# Patient Record
Sex: Female | Born: 1950 | Race: White | Hispanic: No | Marital: Married | Smoking: Never smoker
Health system: Southern US, Community
[De-identification: ages and names within clinical notes are randomized; demographics above are authoritative.]

## PROBLEM LIST (undated history)

## (undated) DIAGNOSIS — I10 Essential (primary) hypertension: Secondary | ICD-10-CM

## (undated) DIAGNOSIS — T7840XA Allergy, unspecified, initial encounter: Secondary | ICD-10-CM

## (undated) DIAGNOSIS — R928 Other abnormal and inconclusive findings on diagnostic imaging of breast: Secondary | ICD-10-CM

## (undated) DIAGNOSIS — K5730 Diverticulosis of large intestine without perforation or abscess without bleeding: Secondary | ICD-10-CM

## (undated) DIAGNOSIS — E890 Postprocedural hypothyroidism: Secondary | ICD-10-CM

## (undated) DIAGNOSIS — Z1211 Encounter for screening for malignant neoplasm of colon: Secondary | ICD-10-CM

## (undated) DIAGNOSIS — Z9889 Other specified postprocedural states: Secondary | ICD-10-CM

## (undated) DIAGNOSIS — Z124 Encounter for screening for malignant neoplasm of cervix: Secondary | ICD-10-CM

## (undated) DIAGNOSIS — R112 Nausea with vomiting, unspecified: Secondary | ICD-10-CM

## (undated) DIAGNOSIS — N841 Polyp of cervix uteri: Secondary | ICD-10-CM

## (undated) DIAGNOSIS — K219 Gastro-esophageal reflux disease without esophagitis: Secondary | ICD-10-CM

## (undated) DIAGNOSIS — E7800 Pure hypercholesterolemia, unspecified: Secondary | ICD-10-CM

## (undated) HISTORY — DX: Diverticulosis of large intestine without perforation or abscess without bleeding: K57.30

## (undated) HISTORY — PX: OTHER SURGICAL HISTORY: SHX169

## (undated) HISTORY — DX: Pure hypercholesterolemia, unspecified: E78.00

## (undated) HISTORY — DX: Allergy, unspecified, initial encounter: T78.40XA

## (undated) HISTORY — DX: Essential (primary) hypertension: I10

## (undated) HISTORY — DX: Postprocedural hypothyroidism: E89.0

## (undated) HISTORY — DX: Encounter for screening for malignant neoplasm of colon: Z12.11

## (undated) HISTORY — DX: Encounter for screening for malignant neoplasm of cervix: Z12.4

## (undated) HISTORY — DX: Polyp of cervix uteri: N84.1

## (undated) HISTORY — DX: Gastro-esophageal reflux disease without esophagitis: K21.9

---

## 1989-03-03 HISTORY — PX: TUBAL LIGATION: SHX77

## 1998-04-14 ENCOUNTER — Ambulatory Visit (HOSPITAL_COMMUNITY): Admission: RE | Admit: 1998-04-14 | Discharge: 1998-04-14 | Payer: Self-pay | Admitting: Family Medicine

## 1999-09-07 ENCOUNTER — Ambulatory Visit (HOSPITAL_COMMUNITY): Admission: RE | Admit: 1999-09-07 | Discharge: 1999-09-07 | Payer: Self-pay | Admitting: Unknown Physician Specialty

## 1999-12-06 HISTORY — PX: ENDOMETRIAL BIOPSY: SHX622

## 1999-12-31 ENCOUNTER — Other Ambulatory Visit: Admission: RE | Admit: 1999-12-31 | Discharge: 1999-12-31 | Payer: Self-pay | Admitting: Obstetrics and Gynecology

## 1999-12-31 ENCOUNTER — Encounter (INDEPENDENT_AMBULATORY_CARE_PROVIDER_SITE_OTHER): Payer: Self-pay

## 2000-10-20 ENCOUNTER — Other Ambulatory Visit: Admission: RE | Admit: 2000-10-20 | Discharge: 2000-10-20 | Payer: Self-pay | Admitting: Obstetrics and Gynecology

## 2002-09-19 ENCOUNTER — Other Ambulatory Visit: Admission: RE | Admit: 2002-09-19 | Discharge: 2002-09-19 | Payer: Self-pay | Admitting: Obstetrics and Gynecology

## 2003-10-03 ENCOUNTER — Encounter: Admission: RE | Admit: 2003-10-03 | Discharge: 2003-10-03 | Payer: Self-pay

## 2003-10-08 ENCOUNTER — Encounter: Admission: RE | Admit: 2003-10-08 | Discharge: 2003-10-08 | Payer: Self-pay | Admitting: Unknown Physician Specialty

## 2003-10-14 ENCOUNTER — Ambulatory Visit (HOSPITAL_COMMUNITY): Admission: RE | Admit: 2003-10-14 | Discharge: 2003-10-14 | Payer: Self-pay | Admitting: Obstetrics and Gynecology

## 2004-12-08 ENCOUNTER — Ambulatory Visit (HOSPITAL_COMMUNITY): Admission: RE | Admit: 2004-12-08 | Discharge: 2004-12-08 | Payer: Self-pay | Admitting: *Deleted

## 2005-12-12 ENCOUNTER — Emergency Department (HOSPITAL_COMMUNITY): Admission: EM | Admit: 2005-12-12 | Discharge: 2005-12-12 | Payer: Self-pay | Admitting: Emergency Medicine

## 2006-08-16 ENCOUNTER — Ambulatory Visit (HOSPITAL_COMMUNITY): Admission: RE | Admit: 2006-08-16 | Discharge: 2006-08-16 | Payer: 59 | Admitting: Obstetrics and Gynecology

## 2007-06-05 HISTORY — PX: COLONOSCOPY: SHX174

## 2007-06-18 ENCOUNTER — Ambulatory Visit: Payer: Self-pay | Admitting: Gastroenterology

## 2007-06-25 ENCOUNTER — Ambulatory Visit: Payer: Self-pay | Admitting: Gastroenterology

## 2014-07-28 ENCOUNTER — Ambulatory Visit (INDEPENDENT_AMBULATORY_CARE_PROVIDER_SITE_OTHER): Payer: 59 | Admitting: Family Medicine

## 2014-07-28 ENCOUNTER — Encounter: Payer: Self-pay | Admitting: Family Medicine

## 2014-07-28 VITALS — BP 143/84 | HR 88 | Temp 99.4°F | Resp 18 | Ht 60.5 in | Wt 180.0 lb

## 2014-07-28 DIAGNOSIS — Z1239 Encounter for other screening for malignant neoplasm of breast: Secondary | ICD-10-CM

## 2014-07-28 DIAGNOSIS — L259 Unspecified contact dermatitis, unspecified cause: Secondary | ICD-10-CM

## 2014-07-28 DIAGNOSIS — R040 Epistaxis: Secondary | ICD-10-CM

## 2014-07-28 DIAGNOSIS — L309 Dermatitis, unspecified: Secondary | ICD-10-CM

## 2014-07-28 MED ORDER — HYDROCORTISONE VALERATE 0.2 % EX OINT: TOPICAL_OINTMENT | CUTANEOUS | Status: DC

## 2014-07-28 NOTE — Patient Instructions (Signed)
Buy OTC generic saline nasal spray and use 2-3 sprays in each nostril 2 times per day to irrigate and moisturize nasal passages. This will help cut down on nose bleeds. Also, apply a small amount of vaseline to the lining of the nose1-2 times a day as needed.

## 2014-07-28 NOTE — Progress Notes (Addendum)
Office Note 07/28/2014  CC:  Chief Complaint  Patient presents with  . Establish Care  . Epistaxis  . Ear Drainage    HPI:  Marissa Gibbs is a 63 y.o. White female who is here to establish care. Patient's most recent primary MD: in the remote past she saw an MD at [facility]. Lost to medical care for insurance reasons for at least a decade.  Old records in EPIC/[facility] EMR were reviewed prior to or during today's visit.  Her GYN care has been very sparse over the last 12 years.  Needs mammo and pap.  Ears itch very bad and they "run".  She scratches them a lot.  Prob for the last year, has not seen MD for it. Some recurrent nasal cong/runny nose, sneezing, "sinus problems with sinus HA's".   OTC med: generic allergy med with ibupro, takes 1 when it is really bad. Some paranasal sinus pain intermittently.  Nose bleeds the last 6 mo, sometimes 2-3 times per day, sometimes goes a week between bleeds.  Easily stops with ice/pressure.  She denies nasal picking/frequent wiping or blowing of nose. NO injury to nose.    History reviewed. No pertinent past medical history.  Past Surgical History  Procedure Laterality Date  . Endometrial biopsy  12/1999    Benign  . Colonoscopy  06/2007    Normal    Family History  Problem Relation Age of Onset  . Arthritis Mother   . Breast cancer Mother 51  . Hypertension Mother   . Diabetes Mother   . Heart attack Father   . Diabetes Father   . Alcohol abuse Father     History   Social History  . Marital Status: Married    Spouse Name: N/A    Number of Children: N/A  . Years of Education: N/A   Occupational History  . Not on file.   Social History Main Topics  . Smoking status: Never Smoker   . Smokeless tobacco: Never Used  . Alcohol Use: No  . Drug Use: No  . Sexual Activity: Not on file   Other Topics Concern  . Not on file   Social History Narrative   Married, one biologic daughter and one stepson.   Education: 12  grade.   Occupation: Woodworker Tax inspector).   No T/A/Ds.          Outpatient Encounter Prescriptions as of 07/28/2014  Medication Sig  . calcium-vitamin D (OSCAL WITH D) 250-125 MG-UNIT per tablet Take 1 tablet by mouth daily.  . Multiple Vitamins-Minerals (MULTIVITAMIN PO) Take by mouth.  . hydrocortisone valerate ointment (WEST-CORT) 0.2 % Apply with a Q tip 1-2 times per day as needed for dermatitis in ear canal    No Known Allergies  ROS Review of Systems  Constitutional: Negative for fever and fatigue.  HENT: Negative for sore throat.        See HPI  Eyes: Negative for visual disturbance.  Respiratory: Negative for cough.   Cardiovascular: Negative for chest pain.  Gastrointestinal: Negative for nausea and abdominal pain.  Genitourinary: Negative for dysuria.  Musculoskeletal: Negative for back pain and joint swelling.  Skin: Negative for rash.  Neurological: Negative for weakness and headaches.  Hematological: Negative for adenopathy.    PE; Blood pressure 143/84, pulse 88, temperature 99.4 F (37.4 C), temperature source Temporal, resp. rate 18, height 5' 0.5" (1.537 m), weight 180 lb (81.647 kg), SpO2 97.00%. Gen: Alert, well appearing.  Patient is oriented to  person, place, time, and situation. ENT: proximal 1 cm of both EAC's has hyperkeratosis with flaking and mild pinkish discoloration to epithelium.  Left side has a bit of macerated skin in this same vicinity as well.  Nose: no bleeding, no old blood, no lesions, no signif swelling or drainage. CV: RRR, no m/r/g.   LUNGS: CTA bilat, nonlabored resps, good aeration in all lung fields. EXT: no clubbing, cyanosis, or edema.   Pertinent labs:  None today  ASSESSMENT AND PLAN:   New pt: no old records to obtain.  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  Instructions: Buy OTC generic saline nasal spray and use 2-3 sprays in each nostril 2 times per day to  irrigate and moisturize nasal passages. This will help cut down on nose bleeds. Also, apply a small amount of vaseline to the lining of the nose1-2 times a day as needed. Hydrocortisone valerate 0.5% ointment rx'd to apply 1-2 times per day prn to outside of ear canals for eczema.  3) Past due for screening pap and mammogram and routine CPE. I ordered screening mammogram today. She'll make appt to return at her earliest convenience for CPE with pap/pelvic/breast exam.  An After Visit Summary was printed and given to the patient.  Follow up: appt at earliest convenience for CPE with pap/pelvic/breast exam and fasting HP labs.

## 2014-07-28 NOTE — Progress Notes (Signed)
Pre visit review using our clinic review tool, if applicable. No additional management support is needed unless otherwise documented below in the visit note. 

## 2014-07-29 ENCOUNTER — Encounter: Payer: Self-pay | Admitting: Family Medicine

## 2014-07-29 ENCOUNTER — Ambulatory Visit (INDEPENDENT_AMBULATORY_CARE_PROVIDER_SITE_OTHER): Payer: 59 | Admitting: Family Medicine

## 2014-07-29 ENCOUNTER — Other Ambulatory Visit (HOSPITAL_COMMUNITY)
Admission: RE | Admit: 2014-07-29 | Discharge: 2014-07-29 | Disposition: A | Discharge status: Home / Self Care | Payer: 59 | Source: Ambulatory Visit | Attending: Family Medicine | Admitting: Family Medicine

## 2014-07-29 VITALS — BP 182/76 | HR 68 | Temp 97.8°F | Resp 18 | Ht 60.5 in | Wt 180.0 lb

## 2014-07-29 DIAGNOSIS — Z0000 Encounter for general adult medical examination without abnormal findings: Secondary | ICD-10-CM | POA: Insufficient documentation

## 2014-07-29 DIAGNOSIS — Z23 Encounter for immunization: Secondary | ICD-10-CM

## 2014-07-29 DIAGNOSIS — Z1151 Encounter for screening for human papillomavirus (HPV): Secondary | ICD-10-CM | POA: Insufficient documentation

## 2014-07-29 DIAGNOSIS — Z124 Encounter for screening for malignant neoplasm of cervix: Secondary | ICD-10-CM

## 2014-07-29 DIAGNOSIS — Z01419 Encounter for gynecological examination (general) (routine) without abnormal findings: Secondary | ICD-10-CM | POA: Insufficient documentation

## 2014-07-29 DIAGNOSIS — R030 Elevated blood-pressure reading, without diagnosis of hypertension: Secondary | ICD-10-CM | POA: Insufficient documentation

## 2014-07-29 LAB — CBC WITH DIFFERENTIAL/PLATELET
BASOS PCT: 0.7 % (ref 0.0–3.0)
Basophils Absolute: 0 10*3/uL (ref 0.0–0.1)
EOS ABS: 0.1 10*3/uL (ref 0.0–0.7)
EOS PCT: 1.7 % (ref 0.0–5.0)
HEMATOCRIT: 41.6 % (ref 36.0–46.0)
HEMOGLOBIN: 13.8 g/dL (ref 12.0–15.0)
LYMPHS ABS: 1.9 10*3/uL (ref 0.7–4.0)
LYMPHS PCT: 30.9 % (ref 12.0–46.0)
MCHC: 33.2 g/dL (ref 30.0–36.0)
MCV: 91.7 fl (ref 78.0–100.0)
MONOS PCT: 6.7 % (ref 3.0–12.0)
Monocytes Absolute: 0.4 10*3/uL (ref 0.1–1.0)
Neutro Abs: 3.7 10*3/uL (ref 1.4–7.7)
Neutrophils Relative %: 60 % (ref 43.0–77.0)
Platelets: 226 10*3/uL (ref 150.0–400.0)
RBC: 4.53 Mil/uL (ref 3.87–5.11)
RDW: 13.1 % (ref 11.5–15.5)
WBC: 6.2 10*3/uL (ref 4.0–10.5)

## 2014-07-29 MED ORDER — ZOSTER VACCINE LIVE 19400 UNT/0.65ML ~~LOC~~ SOLR: 0.6500 mL | Freq: Once | SUBCUTANEOUS | Status: DC

## 2014-07-29 NOTE — Addendum Note (Signed)
Addended by: Ralph Dowdy on: 07/29/2014 09:02 AM   Modules accepted: Orders

## 2014-07-29 NOTE — Assessment & Plan Note (Signed)
Reviewed age and gender appropriate health maintenance issues (prudent diet, regular exercise, health risks of tobacco and excessive alcohol, use of seatbelts, fire alarms in home, use of sunscreen).  Also reviewed age and gender appropriate health screening as well as vaccine recommendations. HP labs today---fasting. She declined flu vaccine but accepted Tdap booster. Zostavax rx printed and given to pt today. Pap smear + HPV testing sent to lab. Breast exam normal.  Mammogram has been ordered for breast ca screening.

## 2014-07-29 NOTE — Progress Notes (Signed)
Office Note 07/29/2014  CC:  Chief Complaint  Patient presents with  . Annual Exam    HPI:  Marissa Gibbs is a 63 y.o. White female who is here for her CPE with pap/pelvic/breast.  Last pap/pelvic/breast exam: > 10 yrs ago.  Denies hx of cervical abnormality/abnormal pap. She has been postmenopausal for about 15 yrs, no vaginal bleeding. Screening mammogram has been ordered.  BP up today here, she is nervous. She monitors bp at home some and says the majority of the time it is <140/90 and the highest systolic is in the 443X sometimes.   No past medical history on file.  Past Surgical History  Procedure Laterality Date  . Endometrial biopsy  12/1999    Benign  . Colonoscopy  06/2007    Normal    Family History  Problem Relation Age of Onset  . Arthritis Mother   . Breast cancer Mother 12  . Hypertension Mother   . Diabetes Mother   . Heart attack Father   . Diabetes Father   . Alcohol abuse Father     History   Social History  . Marital Status: Married    Spouse Name: N/A    Number of Children: N/A  . Years of Education: N/A   Occupational History  . Not on file.   Social History Main Topics  . Smoking status: Never Smoker   . Smokeless tobacco: Never Used  . Alcohol Use: No  . Drug Use: No  . Sexual Activity: Not on file   Other Topics Concern  . Not on file   Social History Narrative   Married, one biologic daughter and one stepson.   Education: 12 grade.   Occupation: Woodworker Tax inspector).   No T/A/Ds.          Outpatient Prescriptions Prior to Visit  Medication Sig Dispense Refill  . calcium-vitamin D (OSCAL WITH D) 250-125 MG-UNIT per tablet Take 1 tablet by mouth daily.      . hydrocortisone valerate ointment (WEST-CORT) 0.2 % Apply with a Q tip 1-2 times per day as needed for dermatitis in ear canal  15 g  2  . Multiple Vitamins-Minerals (MULTIVITAMIN PO) Take by mouth.       No facility-administered medications prior to  visit.    No Known Allergies  ROS Review of Systems  Constitutional: Negative for fever, chills, appetite change and fatigue.  HENT: Negative for congestion, dental problem, ear pain and sore throat.   Eyes: Negative for discharge, redness and visual disturbance.  Respiratory: Negative for cough, chest tightness, shortness of breath and wheezing.   Cardiovascular: Negative for chest pain, palpitations and leg swelling.  Gastrointestinal: Negative for nausea, vomiting, abdominal pain, diarrhea and blood in stool.  Genitourinary: Negative for dysuria, urgency, frequency, hematuria, flank pain and difficulty urinating.  Musculoskeletal: Negative for arthralgias, back pain, joint swelling, myalgias and neck stiffness.  Skin: Negative for pallor and rash.  Neurological: Negative for dizziness, speech difficulty, weakness and headaches.  Hematological: Negative for adenopathy. Does not bruise/bleed easily.  Psychiatric/Behavioral: Negative for confusion and sleep disturbance. The patient is not nervous/anxious.     PE; Blood pressure 182/76, pulse 68, temperature 97.8 F (36.6 C), temperature source Oral, resp. rate 18, height 5' 0.5" (1.537 m), weight 180 lb (81.647 kg), SpO2 99.00%. Gen: Alert, well appearing.  Patient is oriented to person, place, time, and situation. AFFECT: pleasant, lucid thought and speech. ENT: Ears: EACs clear, normal epithelium.  TMs with good  light reflex and landmarks bilaterally.  Eyes: no injection, icteris, swelling, or exudate.  EOMI, PERRLA. Nose: no drainage or turbinate edema/swelling.  No injection or focal lesion.  Mouth: lips without lesion/swelling.  Oral mucosa pink and moist.  Dentition intact and without obvious caries or gingival swelling.  Oropharynx without erythema, exudate, or swelling.  Neck: supple/nontender.  No LAD, mass, or TM.  Carotid pulses 2+ bilaterally, without bruits. CV: RRR, no m/r/g.   LUNGS: CTA bilat, nonlabored resps, good  aeration in all lung fields. ABD: soft, NT, ND, BS normal.  No hepatospenomegaly or mass.  No bruits. EXT: no clubbing, cyanosis, or edema.  Musculoskeletal: no joint swelling, erythema, warmth, or tenderness.  ROM of all joints intact. Skin - no sores or suspicious lesions or rashes or color changes Breasts are symmetric.  No dominant, discrete, fixed  or suspicious masses are noted.  No skin or nipple changes or axillary nodes. Vagina and vulva are normal;  no discharge is noted.  Cervix normal without lesions. Uterus anteverted and mobile, normal in size and shape without tenderness.  Adnexa normal in size without masses or tenderness. Pap Smear - is due and ordered today. Exam chaperoned by female assistant--CMA Jacklynn Ganong.   Pertinent labs:  None today  ASSESSMENT AND PLAN:   Health maintenance examination Reviewed age and gender appropriate health maintenance issues (prudent diet, regular exercise, health risks of tobacco and excessive alcohol, use of seatbelts, fire alarms in home, use of sunscreen).  Also reviewed age and gender appropriate health screening as well as vaccine recommendations. HP labs today---fasting. She declined flu vaccine but accepted Tdap booster. Zostavax rx printed and given to pt today. Pap smear + HPV testing sent to lab. Breast exam normal.  Mammogram has been ordered for breast ca screening.  Elevated blood pressure reading without diagnosis of hypertension Continue home monitoring: call or return if not consistently (average) <140/90. We'll have o/v to review numbers in 6 mo.   An After Visit Summary was printed and given to the patient.  FOLLOW UP:  Return in about 6 months (around 01/29/2015) for f/u blood pressure.

## 2014-07-29 NOTE — Assessment & Plan Note (Signed)
Continue home monitoring: call or return if not consistently (average) <140/90. We'll have o/v to review numbers in 6 mo.

## 2014-07-29 NOTE — Progress Notes (Signed)
Pre visit review using our clinic review tool, if applicable. No additional management support is needed unless otherwise documented below in the visit note. 

## 2014-07-30 LAB — COMPREHENSIVE METABOLIC PANEL
ALK PHOS: 54 U/L (ref 39–117)
ALT: 16 U/L (ref 0–35)
AST: 15 U/L (ref 0–37)
Albumin: 4 g/dL (ref 3.5–5.2)
BUN: 7 mg/dL (ref 6–23)
CO2: 26 meq/L (ref 19–32)
CREATININE: 0.6 mg/dL (ref 0.4–1.2)
Calcium: 9.3 mg/dL (ref 8.4–10.5)
Chloride: 102 mEq/L (ref 96–112)
GFR: 116.69 mL/min (ref >60.00)
GLUCOSE: 84 mg/dL (ref 70–99)
POTASSIUM: 3.9 meq/L (ref 3.5–5.1)
Sodium: 137 mEq/L (ref 135–145)
TOTAL PROTEIN: 7.1 g/dL (ref 6.0–8.3)
Total Bilirubin: 0.2 mg/dL (ref 0.2–1.2)

## 2014-07-30 LAB — LIPID PANEL
CHOL/HDL RATIO: 4
CHOLESTEROL: 199 mg/dL (ref 0–200)
HDL: 44.7 mg/dL (ref >39.00)
LDL Cholesterol: 127 mg/dL — ABNORMAL HIGH (ref 0–99)
NonHDL: 154.3
Triglycerides: 136 mg/dL (ref 0.0–149.0)
VLDL: 27.2 mg/dL (ref 0.0–40.0)

## 2014-07-30 LAB — TSH: TSH: 2.35 u[IU]/mL (ref 0.35–4.50)

## 2014-07-31 ENCOUNTER — Encounter: Payer: Self-pay | Admitting: Family Medicine

## 2014-07-31 DIAGNOSIS — Z124 Encounter for screening for malignant neoplasm of cervix: Secondary | ICD-10-CM | POA: Insufficient documentation

## 2014-07-31 LAB — CYTOLOGY - PAP

## 2014-08-04 ENCOUNTER — Ambulatory Visit
Admission: RE | Admit: 2014-08-04 | Discharge: 2014-08-04 | Disposition: A | Discharge status: Home / Self Care | Payer: 59 | Source: Ambulatory Visit | Attending: Family Medicine | Admitting: Family Medicine

## 2014-08-04 ENCOUNTER — Ambulatory Visit: Payer: 59

## 2014-08-04 DIAGNOSIS — Z1239 Encounter for other screening for malignant neoplasm of breast: Secondary | ICD-10-CM

## 2015-01-29 ENCOUNTER — Ambulatory Visit: Payer: 59 | Admitting: Family Medicine

## 2015-02-13 ENCOUNTER — Encounter: Payer: Self-pay | Admitting: Nurse Practitioner

## 2015-02-13 ENCOUNTER — Ambulatory Visit (INDEPENDENT_AMBULATORY_CARE_PROVIDER_SITE_OTHER): Payer: 59 | Admitting: Nurse Practitioner

## 2015-02-13 VITALS — BP 143/81 | HR 79 | Temp 97.8°F | Ht 60.0 in | Wt 177.0 lb

## 2015-02-13 DIAGNOSIS — R319 Hematuria, unspecified: Secondary | ICD-10-CM

## 2015-02-13 LAB — POCT URINALYSIS DIPSTICK
BILIRUBIN UA: NEGATIVE
GLUCOSE UA: NEGATIVE
Ketones, UA: NEGATIVE
NITRITE UA: NEGATIVE
PH UA: 7
Protein, UA: NEGATIVE
SPEC GRAV UA: 1.01
Urobilinogen, UA: 0.2

## 2015-02-13 MED ORDER — SULFAMETHOXAZOLE-TRIMETHOPRIM 800-160 MG PO TABS: 1.0000 | ORAL_TABLET | Freq: Two times a day (BID) | ORAL | Status: DC

## 2015-02-13 NOTE — Progress Notes (Signed)
   Subjective:    Patient ID: Marissa Gibbs, female    DOB: 08/25/1951, 64 y.o.   MRN: [id number]  Urinary Tract Infection  This is a new (noticed blood in urine this afternoon-pink) problem. The current episode started today. The patient is experiencing no pain. There has been no fever. Associated symptoms include frequency. Pertinent negatives include no chills, discharge, flank pain, hesitancy, nausea or urgency. She has tried nothing for the symptoms.      Review of Systems  Constitutional: Negative for chills.  Gastrointestinal: Negative for nausea.  Genitourinary: Positive for frequency. Negative for hesitancy, urgency and flank pain.       Objective:   Physical Exam  Constitutional: She appears well-developed and well-nourished. No distress.  HENT:  Head: Normocephalic and atraumatic.  Eyes: Conjunctivae are normal. Right eye exhibits no discharge. Left eye exhibits no discharge.  Neck: Normal range of motion.  Cardiovascular: Normal rate.   Pulmonary/Chest: Effort normal.  Abdominal: Soft. She exhibits no distension and no mass. There is no tenderness. There is no rebound and no guarding.  Musculoskeletal: She exhibits no tenderness (no CVA tenderness).  Skin: Skin is warm and dry.  Psychiatric: She has a normal mood and affect. Her behavior is normal. Thought content normal.  Vitals reviewed.         Assessment & Plan:  1. Hematuria - POCT urinalysis dipstick- pos blood & leuks - Urine culture - sulfamethoxazole-trimethoprim (BACTRIM DS,SEPTRA DS) 800-160 MG per tablet; Take 1 tablet by mouth 2 (two) times daily.  Dispense: 6 tablet; Refill: 0  See pt instructions. F/u prn

## 2015-02-13 NOTE — Progress Notes (Signed)
Pre visit review using our clinic review tool, if applicable. No additional management support is needed unless otherwise documented below in the visit note. 

## 2015-02-13 NOTE — Patient Instructions (Signed)
Start antibiotic. Our office will call if we need to change the antibiotic.  Sip hydrating fluids (water, juice, colorless soda, decaff tea) every hour to flush kidneys. USE NEIL MED sinus rinse daily for sinus drainage. Urinary Tract Infection Urinary tract infections (UTIs) can develop anywhere along your urinary tract. Your urinary tract is your body's drainage system for removing wastes and extra water. Your urinary tract includes two kidneys, two ureters, a bladder, and a urethra. Your kidneys are a pair of bean-shaped organs. Each kidney is about the size of your fist. They are located below your ribs, one on each side of your spine. CAUSES Infections are caused by microbes, which are microscopic organisms, including fungi, viruses, and bacteria. These organisms are so small that they can only be seen through a microscope. Bacteria are the microbes that most commonly cause UTIs. SYMPTOMS  Symptoms of UTIs may vary by age and gender of the patient and by the location of the infection. Symptoms in young women typically include a frequent and intense urge to urinate and a painful, burning feeling in the bladder or urethra during urination. Older women and men are more likely to be tired, shaky, and weak and have muscle aches and abdominal pain. A fever may mean the infection is in your kidneys. Other symptoms of a kidney infection include pain in your back or sides below the ribs, nausea, and vomiting. DIAGNOSIS To diagnose a UTI, your caregiver will ask you about your symptoms. Your caregiver also will ask to provide a urine sample. The urine sample will be tested for bacteria and white blood cells. White blood cells are made by your body to help fight infection. TREATMENT  Typically, UTIs can be treated with medication. Because most UTIs are caused by a bacterial infection, they usually can be treated with the use of antibiotics. The choice of antibiotic and length of treatment depend on your  symptoms and the type of bacteria causing your infection. HOME CARE INSTRUCTIONS  If you were prescribed antibiotics, take them exactly as your caregiver instructs you. Finish the medication even if you feel better after you have only taken some of the medication.  Drink enough water and fluids to keep your urine clear or pale yellow.  Avoid caffeine, tea, and carbonated beverages. They tend to irritate your bladder.  Empty your bladder often. Avoid holding urine for long periods of time.  Empty your bladder before and after sexual intercourse.  After a bowel movement, women should cleanse from front to back. Use each tissue only once. SEEK MEDICAL CARE IF:   You have back pain.  You develop a fever.  Your symptoms do not begin to resolve within 3 days. SEEK IMMEDIATE MEDICAL CARE IF:   You have severe back pain or lower abdominal pain.  You develop chills.  You have nausea or vomiting.  You have continued burning or discomfort with urination. MAKE SURE YOU:   Understand these instructions.  Will watch your condition.  Will get help right away if you are not doing well or get worse. Document Released: 08/31/2005 Document Revised: 05/22/2012 Document Reviewed: 12/30/2011 [facility] Patient Information 2014 [place].

## 2015-02-15 LAB — URINE CULTURE: Colony Count: 50000

## 2015-02-16 ENCOUNTER — Telehealth: Payer: Self-pay | Admitting: Nurse Practitioner

## 2015-02-16 NOTE — Telephone Encounter (Signed)
Pt. Returned call and said she was feeling a lot better.

## 2015-02-16 NOTE — Telephone Encounter (Signed)
LMOVM for patient to return call concerning lab results.  

## 2015-02-16 NOTE — Telephone Encounter (Signed)
pls call pt:Ask if feeling better regarding UTI.

## 2015-04-17 ENCOUNTER — Ambulatory Visit (INDEPENDENT_AMBULATORY_CARE_PROVIDER_SITE_OTHER): Payer: 59 | Admitting: Nurse Practitioner

## 2015-04-17 VITALS — BP 128/76 | HR 81 | Temp 97.5°F | Ht 60.0 in | Wt 178.0 lb

## 2015-04-17 DIAGNOSIS — E049 Nontoxic goiter, unspecified: Secondary | ICD-10-CM

## 2015-04-17 LAB — COMPREHENSIVE METABOLIC PANEL
ALBUMIN: 4.5 g/dL (ref 3.5–5.2)
ALK PHOS: 64 U/L (ref 39–117)
ALT: 12 U/L (ref 0–35)
AST: 16 U/L (ref 0–37)
BILIRUBIN TOTAL: 0.3 mg/dL (ref 0.2–1.2)
BUN: 12 mg/dL (ref 6–23)
CO2: 25 meq/L (ref 19–32)
Calcium: 9.5 mg/dL (ref 8.4–10.5)
Chloride: 103 mEq/L (ref 96–112)
Creat: 0.69 mg/dL (ref 0.50–1.10)
Glucose, Bld: 105 mg/dL — ABNORMAL HIGH (ref 70–99)
Potassium: 4.5 mEq/L (ref 3.5–5.3)
SODIUM: 140 meq/L (ref 135–145)
Total Protein: 7.7 g/dL (ref 6.0–8.3)

## 2015-04-17 LAB — CBC WITH DIFFERENTIAL/PLATELET
BASOS ABS: 0 10*3/uL (ref 0.0–0.1)
Basophils Relative: 0 % (ref 0–1)
EOS PCT: 1 % (ref 0–5)
Eosinophils Absolute: 0.1 10*3/uL (ref 0.0–0.7)
HCT: 40.8 % (ref 36.0–46.0)
HEMOGLOBIN: 13.6 g/dL (ref 12.0–15.0)
Lymphocytes Relative: 15 % (ref 12–46)
Lymphs Abs: 1.4 10*3/uL (ref 0.7–4.0)
MCH: 29.1 pg (ref 26.0–34.0)
MCHC: 33.3 g/dL (ref 30.0–36.0)
MCV: 87.4 fL (ref 78.0–100.0)
MONO ABS: 0.5 10*3/uL (ref 0.1–1.0)
MPV: 9.9 fL (ref 8.6–12.4)
Monocytes Relative: 5 % (ref 3–12)
NEUTROS ABS: 7.4 10*3/uL (ref 1.7–7.7)
NEUTROS PCT: 79 % — AB (ref 43–77)
PLATELETS: 263 10*3/uL (ref 150–400)
RBC: 4.67 MIL/uL (ref 3.87–5.11)
RDW: 13.1 % (ref 11.5–15.5)
WBC: 9.4 10*3/uL (ref 4.0–10.5)

## 2015-04-17 LAB — T4, FREE: FREE T4: 0.83 ng/dL (ref 0.80–1.80)

## 2015-04-17 LAB — TSH: TSH: 2.398 u[IU]/mL (ref 0.350–4.500)

## 2015-04-17 NOTE — Progress Notes (Signed)
Subjective:     Marissa Gibbs is a 64 y.o. female presents w/c/o "knot on neck & scratchy voice. She noticed the knot 1 wk ago & has had scratchy voice for 1-2 mos. Patient denies anterior neck pain, dyspnea, dysphagia, palpitations and bowel changes, rash, temperature intolerance, wt changes. Previous work up has been TSH 07/2014 was nml. TPO ab not performed.  The following portions of the patient's history were reviewed and updated as appropriate: allergies, current medications, past medical history, past social history, past surgical history and problem list.  Review of Systems Pertinent items are noted in HPI.     Objective:    BP 128/76 mmHg  Pulse 81  Temp(Src) 97.5 F (36.4 C) (Oral)  Ht 5' (1.524 m)  Wt 178 lb (80.74 kg)  BMI 34.76 kg/m2  SpO2 94%  General:  alert, cooperative, appears stated age and no distress  Oropharynx: lips, mucosa, and tongue normal; teeth and gums normal   Eyes:  negative findings: lids and lashes normal, conjunctivae and sclerae normal and wearing glasses     Neck: no adenopathy, supple, symmetrical, trachea midline and thyroid: enlarged     Lung: clear to auscultation bilaterally  Heart:  regular rate and rhythm, S1, S2 normal, no murmur, click, rub or gallop              Neuro: normal without focal findings, mental status, speech normal, alert and oriented x3 and gait and station normal   Lab Review Lab Results  Component Value Date   TSH 2.35 07/29/2014   No components found for: T4FREE    Assessment:Plan  1. Goiter - Thyroid peroxidase antibody - US Soft Tissue Head/Neck; Future - CBC with Differential/Platelet - T4, free - TSH - Comprehensive metabolic panel  F/u after thyroid US performed

## 2015-04-17 NOTE — Patient Instructions (Signed)
Please get ultrasound of neck.  Please return few days after ultrasound to discuss results.  Use sinus rinse daily after exposed to dust to decrease postnasal drip. Take claritin daily for itchy ears.  Nice to see you.

## 2015-04-17 NOTE — Progress Notes (Signed)
Pre visit review using our clinic review tool, if applicable. No additional management support is needed unless otherwise documented below in the visit note. 

## 2015-04-18 LAB — THYROID PEROXIDASE ANTIBODY: Thyroperoxidase Ab SerPl-aCnc: <1 IU/mL (ref <9)

## 2015-04-21 ENCOUNTER — Ambulatory Visit (HOSPITAL_BASED_OUTPATIENT_CLINIC_OR_DEPARTMENT_OTHER): Payer: 59

## 2015-04-21 ENCOUNTER — Telehealth: Payer: Self-pay | Admitting: Nurse Practitioner

## 2015-04-21 ENCOUNTER — Ambulatory Visit (HOSPITAL_BASED_OUTPATIENT_CLINIC_OR_DEPARTMENT_OTHER)
Admission: RE | Admit: 2015-04-21 | Discharge: 2015-04-21 | Disposition: A | Discharge status: Home / Self Care | Payer: 59 | Source: Ambulatory Visit | Attending: Nurse Practitioner | Admitting: Nurse Practitioner

## 2015-04-21 DIAGNOSIS — R221 Localized swelling, mass and lump, neck: Secondary | ICD-10-CM | POA: Diagnosis present

## 2015-04-21 DIAGNOSIS — E041 Nontoxic single thyroid nodule: Secondary | ICD-10-CM | POA: Diagnosis not present

## 2015-04-21 DIAGNOSIS — E049 Nontoxic goiter, unspecified: Secondary | ICD-10-CM

## 2015-04-21 NOTE — Telephone Encounter (Signed)
Called and informed patient of results. Patient has [place] today at 6:30p and has a follow up with you on Friday 04/24/15 at 8am. Will scheduled OV in 2 months when she comes in on Friday.

## 2015-04-21 NOTE — Telephone Encounter (Signed)
Thyroid appears to be working OK for now, but numbers could change. Levels should be checked again in 2 mos. Ask if she is getting [place] of neck? Pls sched OV in 2 mos if nothing scheduled.

## 2015-04-22 ENCOUNTER — Telehealth: Payer: Self-pay | Admitting: Nurse Practitioner

## 2015-04-22 DIAGNOSIS — E079 Disorder of thyroid, unspecified: Secondary | ICD-10-CM

## 2015-04-22 NOTE — Telephone Encounter (Signed)
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 FNA is recommended. Discussed w/Ms Vanscyoc. Answered all qtns.  Ref placed to endo.

## 2015-04-24 ENCOUNTER — Ambulatory Visit: Payer: 59 | Admitting: Nurse Practitioner

## 2015-04-24 ENCOUNTER — Telehealth: Payer: Self-pay | Admitting: Family Medicine

## 2015-04-24 ENCOUNTER — Other Ambulatory Visit: Payer: 59

## 2015-04-24 NOTE — Telephone Encounter (Signed)
I think this request needs to go to Endo.

## 2015-04-24 NOTE — Telephone Encounter (Signed)
Patient's insurance is not covering her office visits not did it cover her [place]. She wants to continue with treatment but is running out of money. Patient wants to know if she can have the biopsy done without paying for OV w/Dr. Loanne Drilling? She is requesting a CB.

## 2015-04-27 NOTE — Telephone Encounter (Signed)
Will discuss pt request to have testing performed w/out seeing endo. Given cyst is central thyroid, she may need several aspirations. It may be more cost effective for her to see endo, rather than me order tests through GI.

## 2015-05-08 ENCOUNTER — Other Ambulatory Visit (HOSPITAL_COMMUNITY)
Admission: RE | Admit: 2015-05-08 | Discharge: 2015-05-08 | Disposition: A | Discharge status: Home / Self Care | Payer: 59 | Source: Ambulatory Visit | Attending: Endocrinology | Admitting: Endocrinology

## 2015-05-08 ENCOUNTER — Ambulatory Visit (INDEPENDENT_AMBULATORY_CARE_PROVIDER_SITE_OTHER): Payer: 59 | Admitting: Endocrinology

## 2015-05-08 ENCOUNTER — Encounter: Payer: Self-pay | Admitting: Endocrinology

## 2015-05-08 VITALS — BP 138/90 | HR 80 | Temp 98.2°F | Ht 60.0 in | Wt 177.0 lb

## 2015-05-08 DIAGNOSIS — E041 Nontoxic single thyroid nodule: Secondary | ICD-10-CM | POA: Insufficient documentation

## 2015-05-08 DIAGNOSIS — E079 Disorder of thyroid, unspecified: Secondary | ICD-10-CM

## 2015-05-08 NOTE — Progress Notes (Signed)
   Subjective:    Patient ID: Marissa Gibbs, female    DOB: September 01, 1951, 64 y.o.   MRN: [id number]  HPI Pt states 4 weeks of moderate swelling at the right anterior neck, but no assoc pain.  Pt says the mass is rapidly enlarging, even since recent US. No past medical history on file.  Past Surgical History  Procedure Laterality Date  . Endometrial biopsy  12/1999    Benign  . Colonoscopy  06/2007    Normal    History   Social History  . Marital Status: Married    Spouse Name: N/A  . Number of Children: N/A  . Years of Education: N/A   Occupational History  . Not on file.   Social History Main Topics  . Smoking status: Never Smoker   . Smokeless tobacco: Never Used  . Alcohol Use: No  . Drug Use: No  . Sexual Activity: Not on file   Other Topics Concern  . Not on file   Social History Narrative   Married, one biologic daughter and one stepson.   Education: 12 grade.   Occupation: Woodworker Tax inspector).   No T/A/Ds.          Current Outpatient Prescriptions on File Prior to Visit  Medication Sig Dispense Refill  . calcium-vitamin D (OSCAL WITH D) 250-125 MG-UNIT per tablet Take 1 tablet by mouth daily.    . hydrocortisone valerate ointment (WEST-CORT) 0.2 % Apply with a Q tip 1-2 times per day as needed for dermatitis in ear canal 15 g 2  . Multiple Vitamins-Minerals (MULTIVITAMIN PO) Take by mouth.     No current facility-administered medications on file prior to visit.    No Known Allergies  Family History  Problem Relation Age of Onset  . Arthritis Mother   . Breast cancer Mother 66  . Hypertension Mother   . Diabetes Mother   . Heart attack Father   . Diabetes Father   . Alcohol abuse Father   . Thyroid disease Neg Hx     BP 138/90 mmHg  Pulse 80  Temp(Src) 98.2 F (36.8 C) (Oral)  Ht 5' (1.524 m)  Wt 177 lb (80.287 kg)  BMI 34.57 kg/m2  SpO2 95%    Review of Systems Denies sob and dysphagia    Objective:   Physical  Exam VITAL SIGNS:  See vs page GENERAL: no distress Neck: midline mass, approx 7 cm, slightly soft.  Freely mobile.  No lymphadenopathy at the neck.   Radiol: i reviewed [place] report  Lab Results  Component Value Date   TSH 2.398 04/17/2015   thyroid needle bx: consent obtained, signed form on chart The area is first sprayed with cooling agent local: xylocaine 2%, with epinephrine prep: alcohol pad 2 bxs are done with 25g needles.  16 cc brown fluid are aspirated.   no complications.      Assessment & Plan:  large thyroid cyst, new, uncertain etiology  Patient is advised the following: Patient Instructions  We'll let you know about the fluid results. Please come back for a follow-up appointment in 3 months.   Please call or come back sooner if the thyroid enlarges again.  Sometimes, this needs to be drained again.

## 2015-05-08 NOTE — Patient Instructions (Signed)
We'll let you know about the fluid results. Please come back for a follow-up appointment in 3 months.   Please call or come back sooner if the thyroid enlarges again.  Sometimes, this needs to be drained again.

## 2015-05-13 ENCOUNTER — Telehealth: Payer: Self-pay | Admitting: Endocrinology

## 2015-05-13 NOTE — Telephone Encounter (Signed)
See note below and please advise, Thanks! 

## 2015-05-13 NOTE — Telephone Encounter (Signed)
Options: See how it goes i would be happy to drain again Refer to a surgery specialist. Please let me know.

## 2015-05-13 NOTE — Telephone Encounter (Signed)
Left a voicemail advising of note below. Requested call back if the patient would like to discuss,

## 2015-05-13 NOTE — Telephone Encounter (Signed)
Pt states that the nodule that was biopsied is back and growing again

## 2015-05-14 ENCOUNTER — Telehealth: Payer: Self-pay | Admitting: Endocrinology

## 2015-05-14 DIAGNOSIS — E041 Nontoxic single thyroid nodule: Secondary | ICD-10-CM

## 2015-05-14 NOTE — Telephone Encounter (Signed)
Pt wants the bump on her neck to be lanced again and wants to see if we can work her in this afternoon [phone]

## 2015-05-14 NOTE — Telephone Encounter (Signed)
[place] pt 

## 2015-05-14 NOTE — Telephone Encounter (Signed)
Called pt and she scheduled an appt to have the drain the bump. Pt would like a referral to surgery as well. Be advised.

## 2015-05-14 NOTE — Addendum Note (Signed)
Addended by: Renato Shin on: 05/14/2015 03:16 PM   Modules accepted: Orders

## 2015-05-14 NOTE — Telephone Encounter (Signed)
Referral done

## 2015-05-15 ENCOUNTER — Encounter: Payer: Self-pay | Admitting: Endocrinology

## 2015-05-15 ENCOUNTER — Ambulatory Visit (INDEPENDENT_AMBULATORY_CARE_PROVIDER_SITE_OTHER): Payer: 59 | Admitting: Endocrinology

## 2015-05-15 ENCOUNTER — Encounter: Payer: Self-pay | Admitting: *Deleted

## 2015-05-15 VITALS — BP 150/88 | HR 81 | Temp 98.5°F | Wt 176.0 lb

## 2015-05-15 DIAGNOSIS — E041 Nontoxic single thyroid nodule: Secondary | ICD-10-CM | POA: Diagnosis not present

## 2015-05-15 NOTE — Patient Instructions (Signed)
Please see the surgery doctor.  you will receive a phone call, about a day and time for an appointment Please come back for a follow-up appointment in 3 months

## 2015-05-15 NOTE — Progress Notes (Signed)
   Subjective:    Patient ID: Marissa Gibbs, female    DOB: 10/27/1951, 64 y.o.   MRN: [id number]  HPI Pt had aspiration of the thyroid cyst last week,  She says the fluid has reaccumulated No past medical history on file.  Past Surgical History  Procedure Laterality Date  . Endometrial biopsy  12/1999    Benign  . Colonoscopy  06/2007    Normal    History   Social History  . Marital Status: Married    Spouse Name: N/A  . Number of Children: N/A  . Years of Education: N/A   Occupational History  . Not on file.   Social History Main Topics  . Smoking status: Never Smoker   . Smokeless tobacco: Never Used  . Alcohol Use: No  . Drug Use: No  . Sexual Activity: Not on file   Other Topics Concern  . Not on file   Social History Narrative   Married, one biologic daughter and one stepson.   Education: 12 grade.   Occupation: Woodworker Tax inspector).   No T/A/Ds.          Current Outpatient Prescriptions on File Prior to Visit  Medication Sig Dispense Refill  . calcium-vitamin D (OSCAL WITH D) 250-125 MG-UNIT per tablet Take 1 tablet by mouth daily.    . hydrocortisone valerate ointment (WEST-CORT) 0.2 % Apply with a Q tip 1-2 times per day as needed for dermatitis in ear canal 15 g 2  . Multiple Vitamins-Minerals (MULTIVITAMIN PO) Take by mouth.     No current facility-administered medications on file prior to visit.    No Known Allergies  Family History  Problem Relation Age of Onset  . Arthritis Mother   . Breast cancer Mother 63  . Hypertension Mother   . Diabetes Mother   . Heart attack Father   . Diabetes Father   . Alcohol abuse Father   . Thyroid disease Neg Hx     BP 150/88 mmHg  Pulse 81  Temp(Src) 98.5 F (36.9 C) (Oral)  Wt 176 lb (79.833 kg)  SpO2 95%  Review of Systems No neck pain    Objective:   Physical Exam VITAL SIGNS:  See vs page GENERAL: no distress Neck: 5 cm midline swelling.  Not as firm as last week,  though  thyroid needle bx: consent obtained, signed form on chart The area is first sprayed with cooling agent local: xylocaine 2%, with epinephrine prep: alcohol pad 3 cc brown fluid is aspirated with 25g needle, and is discarded no complications     Assessment & Plan:  Thyroid cyst, recurrent.    Patient is advised the following: Patient Instructions  Please see the surgery doctor.  you will receive a phone call, about a day and time for an appointment Please come back for a follow-up appointment in 3 months

## 2015-07-02 HISTORY — PX: THYROIDECTOMY: SHX17

## 2015-07-05 ENCOUNTER — Encounter: Payer: Self-pay | Admitting: Family Medicine

## 2015-07-19 ENCOUNTER — Encounter: Payer: Self-pay | Admitting: Family Medicine

## 2015-08-03 ENCOUNTER — Encounter: Payer: Self-pay | Admitting: Family Medicine

## 2015-08-14 ENCOUNTER — Ambulatory Visit: Payer: 59 | Admitting: Endocrinology

## 2015-10-01 ENCOUNTER — Other Ambulatory Visit: Payer: Self-pay

## 2015-10-01 DIAGNOSIS — Z1231 Encounter for screening mammogram for malignant neoplasm of breast: Secondary | ICD-10-CM

## 2015-10-02 ENCOUNTER — Telehealth: Payer: Self-pay | Admitting: Family Medicine

## 2015-10-02 ENCOUNTER — Ambulatory Visit (INDEPENDENT_AMBULATORY_CARE_PROVIDER_SITE_OTHER): Payer: 59 | Admitting: Family Medicine

## 2015-10-02 ENCOUNTER — Encounter: Payer: Self-pay | Admitting: Family Medicine

## 2015-10-02 VITALS — BP 155/83 | HR 78 | Temp 97.8°F | Resp 20 | Ht 60.0 in | Wt 173.8 lb

## 2015-10-02 DIAGNOSIS — Z1211 Encounter for screening for malignant neoplasm of colon: Secondary | ICD-10-CM

## 2015-10-02 DIAGNOSIS — R030 Elevated blood-pressure reading, without diagnosis of hypertension: Secondary | ICD-10-CM

## 2015-10-02 DIAGNOSIS — E890 Postprocedural hypothyroidism: Secondary | ICD-10-CM | POA: Insufficient documentation

## 2015-10-02 DIAGNOSIS — E0590 Thyrotoxicosis, unspecified without thyrotoxic crisis or storm: Secondary | ICD-10-CM

## 2015-10-02 DIAGNOSIS — E7800 Pure hypercholesterolemia, unspecified: Secondary | ICD-10-CM | POA: Diagnosis not present

## 2015-10-02 DIAGNOSIS — Z23 Encounter for immunization: Secondary | ICD-10-CM

## 2015-10-02 DIAGNOSIS — Z0000 Encounter for general adult medical examination without abnormal findings: Secondary | ICD-10-CM | POA: Insufficient documentation

## 2015-10-02 LAB — CBC WITH DIFFERENTIAL/PLATELET
BASOS PCT: 0.4 % (ref 0.0–3.0)
Basophils Absolute: 0 10*3/uL (ref 0.0–0.1)
EOS ABS: 0.1 10*3/uL (ref 0.0–0.7)
EOS PCT: 1.7 % (ref 0.0–5.0)
HCT: 42.2 % (ref 36.0–46.0)
HEMOGLOBIN: 13.9 g/dL (ref 12.0–15.0)
LYMPHS ABS: 2 10*3/uL (ref 0.7–4.0)
Lymphocytes Relative: 34.6 % (ref 12.0–46.0)
MCHC: 33 g/dL (ref 30.0–36.0)
MCV: 90.8 fl (ref 78.0–100.0)
MONO ABS: 0.4 10*3/uL (ref 0.1–1.0)
Monocytes Relative: 7.1 % (ref 3.0–12.0)
NEUTROS ABS: 3.2 10*3/uL (ref 1.4–7.7)
NEUTROS PCT: 56.2 % (ref 43.0–77.0)
PLATELETS: 245 10*3/uL (ref 150.0–400.0)
RBC: 4.65 Mil/uL (ref 3.87–5.11)
RDW: 12.5 % (ref 11.5–15.5)
WBC: 5.8 10*3/uL (ref 4.0–10.5)

## 2015-10-02 LAB — COMPREHENSIVE METABOLIC PANEL
ALT: 11 U/L (ref 0–35)
AST: 12 U/L (ref 0–37)
Albumin: 4.2 g/dL (ref 3.5–5.2)
Alkaline Phosphatase: 60 U/L (ref 39–117)
BUN: 14 mg/dL (ref 6–23)
CHLORIDE: 104 meq/L (ref 96–112)
CO2: 29 meq/L (ref 19–32)
CREATININE: 0.55 mg/dL (ref 0.40–1.20)
Calcium: 9.9 mg/dL (ref 8.4–10.5)
GFR: 118.69 mL/min (ref >60.00)
GLUCOSE: 94 mg/dL (ref 70–99)
POTASSIUM: 5 meq/L (ref 3.5–5.1)
SODIUM: 141 meq/L (ref 135–145)
Total Bilirubin: 0.4 mg/dL (ref 0.2–1.2)
Total Protein: 6.9 g/dL (ref 6.0–8.3)

## 2015-10-02 LAB — LIPID PANEL
CHOL/HDL RATIO: 4
CHOLESTEROL: 193 mg/dL (ref 0–200)
HDL: 50.1 mg/dL (ref >39.00)
LDL CALC: 129 mg/dL — AB (ref 0–99)
NonHDL: 142.85
TRIGLYCERIDES: 71 mg/dL (ref 0.0–149.0)
VLDL: 14.2 mg/dL (ref 0.0–40.0)

## 2015-10-02 LAB — T4, FREE: Free T4: 1.23 ng/dL (ref 0.60–1.60)

## 2015-10-02 LAB — HEMOGLOBIN A1C: Hgb A1c MFr Bld: 5.8 % (ref 4.6–6.5)

## 2015-10-02 LAB — TSH: TSH: 0.06 u[IU]/mL — ABNORMAL LOW (ref 0.35–4.50)

## 2015-10-02 MED ORDER — HYDROCORTISONE VALERATE 0.2 % EX OINT
TOPICAL_OINTMENT | CUTANEOUS | Status: DC
Start: 2015-10-02 — End: 2018-01-03

## 2015-10-02 MED ORDER — LEVOTHYROXINE SODIUM 112 MCG PO TABS: 112.0000 ug | ORAL_TABLET | Freq: Every day | ORAL | Status: DC

## 2015-10-02 NOTE — Patient Instructions (Addendum)
Take your BP 3 times a week. If they are consistently above 140 on the top and/or 80 on the bottom I would want to see you and start a medication.  Try to take a fish oil supplement daily.  Exercise 3 times a week, eat a low saturated fat diet, low salt, lean meats, fresh or frozen veggies and fruits.  You received your flu shot today, you may feel sluggish tomorrow, this is OK. Your body is building immunity. You cannot get the flu from the flu shot.  I will call you with results  Health Maintenance, Female Adopting a healthy lifestyle and getting preventive care can go a long way to promote health and wellness. Talk with your health care provider about what schedule of regular examinations is right for you. This is a good chance for you to check in with your provider about disease prevention and staying healthy. In between checkups, there are plenty of things you can do on your own. Experts have done a lot of research about which lifestyle changes and preventive measures are most likely to keep you healthy. Ask your health care provider for more information. WEIGHT AND DIET  Eat a healthy diet  Be sure to include plenty of vegetables, fruits, low-fat dairy products, and lean protein.  Do not eat a lot of foods high in solid fats, added sugars, or salt.  Get regular exercise. This is one of the most important things you can do for your health.  Most adults should exercise for at least 150 minutes each week. The exercise should increase your heart rate and make you sweat (moderate-intensity exercise).  Most adults should also do strengthening exercises at least twice a week. This is in addition to the moderate-intensity exercise.  Maintain a healthy weight  Body mass index (BMI) is a measurement that can be used to identify possible weight problems. It estimates body fat based on height and weight. Your health care provider can help determine your BMI and help you achieve or maintain a  healthy weight.  For females 22 years of age and older:   A BMI below 18.5 is considered underweight.  A BMI of 18.5 to 24.9 is normal.  A BMI of 25 to 29.9 is considered overweight.  A BMI of 30 and above is considered obese.  Watch levels of cholesterol and blood lipids  You should start having your blood tested for lipids and cholesterol at 63 years of age, then have this test every 5 years.  You may need to have your cholesterol levels checked more often if:  Your lipid or cholesterol levels are high.  You are older than 63 years of age.  You are at high risk for heart disease.  CANCER SCREENING   Lung Cancer  Lung cancer screening is recommended for adults 16-54 years old who are at high risk for lung cancer because of a history of smoking.  A yearly low-dose CT scan of the lungs is recommended for people who:  Currently smoke.  Have quit within the past 15 years.  Have at least a 30-pack-year history of smoking. A pack year is smoking an average of one pack of cigarettes a day for 1 year.  Yearly screening should continue until it has been 15 years since you quit.  Yearly screening should stop if you develop a health problem that would prevent you from having lung cancer treatment.  Breast Cancer  Practice breast self-awareness. This means understanding how your breasts normally  appear and feel.  It also means doing regular breast self-exams. Let your health care provider know about any changes, no matter how small.  If you are in your 20s or 30s, you should have a clinical breast exam (CBE) by a health care provider every 1-3 years as part of a regular health exam.  If you are 66 or older, have a CBE every year. Also consider having a breast X-ray (mammogram) every year.  If you have a family history of breast cancer, talk to your health care provider about genetic screening.  If you are at high risk for breast cancer, talk to your health care provider  about having an MRI and a mammogram every year.  Breast cancer gene (BRCA) assessment is recommended for women who have family members with BRCA-related cancers. BRCA-related cancers include:  Breast.  Ovarian.  Tubal.  Peritoneal cancers.  Results of the assessment will determine the need for genetic counseling and BRCA1 and BRCA2 testing. Cervical Cancer Your health care provider may recommend that you be screened regularly for cancer of the pelvic organs (ovaries, uterus, and vagina). This screening involves a pelvic examination, including checking for microscopic changes to the surface of your cervix (Pap test). You may be encouraged to have this screening done every 3 years, beginning at age 30.  For women ages 47-65, health care providers may recommend pelvic exams and Pap testing every 3 years, or they may recommend the Pap and pelvic exam, combined with testing for human papilloma virus (HPV), every 5 years. Some types of HPV increase your risk of cervical cancer. Testing for HPV may also be done on women of any age with unclear Pap test results.  Other health care providers may not recommend any screening for nonpregnant women who are considered low risk for pelvic cancer and who do not have symptoms. Ask your health care provider if a screening pelvic exam is right for you.  If you have had past treatment for cervical cancer or a condition that could lead to cancer, you need Pap tests and screening for cancer for at least 20 years after your treatment. If Pap tests have been discontinued, your risk factors (such as having a new sexual partner) need to be reassessed to determine if screening should resume. Some women have medical problems that increase the chance of getting cervical cancer. In these cases, your health care provider may recommend more frequent screening and Pap tests. Colorectal Cancer  This type of cancer can be detected and often prevented.  Routine colorectal  cancer screening usually begins at 63 years of age and continues through 63 years of age.  Your health care provider may recommend screening at an earlier age if you have risk factors for colon cancer.  Your health care provider may also recommend using home test kits to check for hidden blood in the stool.  A small camera at the end of a tube can be used to examine your colon directly (sigmoidoscopy or colonoscopy). This is done to check for the earliest forms of colorectal cancer.  Routine screening usually begins at age 20.  Direct examination of the colon should be repeated every 5-10 years through 63 years of age. However, you may need to be screened more often if early forms of precancerous polyps or small growths are found. Skin Cancer  Check your skin from head to toe regularly.  Tell your health care provider about any new moles or changes in moles, especially if there is  a change in a mole's shape or color.  Also tell your health care provider if you have a mole that is larger than the size of a pencil eraser.  Always use sunscreen. Apply sunscreen liberally and repeatedly throughout the day.  Protect yourself by wearing long sleeves, pants, a wide-brimmed hat, and sunglasses whenever you are outside. HEART DISEASE, DIABETES, AND HIGH BLOOD PRESSURE   High blood pressure causes heart disease and increases the risk of stroke. High blood pressure is more likely to develop in:  People who have blood pressure in the high end of the normal range (130-139/85-89 mm Hg).  People who are overweight or obese.  People who are African American.  If you are 57-59 years of age, have your blood pressure checked every 3-5 years. If you are 55 years of age or older, have your blood pressure checked every year. You should have your blood pressure measured twice--once when you are at a hospital or clinic, and once when you are not at a hospital or clinic. Record the average of the two  measurements. To check your blood pressure when you are not at a hospital or clinic, you can use:  An automated blood pressure machine at a pharmacy.  A home blood pressure monitor.  If you are between 2 years and 64 years old, ask your health care provider if you should take aspirin to prevent strokes.  Have regular diabetes screenings. This involves taking a blood sample to check your fasting blood sugar level.  If you are at a normal weight and have a low risk for diabetes, have this test once every three years after 63 years of age.  If you are overweight and have a high risk for diabetes, consider being tested at a younger age or more often. PREVENTING INFECTION  Hepatitis B  If you have a higher risk for hepatitis B, you should be screened for this virus. You are considered at high risk for hepatitis B if:  You were born in a country where hepatitis B is common. Ask your health care provider which countries are considered high risk.  Your parents were born in a high-risk country, and you have not been immunized against hepatitis B (hepatitis B vaccine).  You have HIV or AIDS.  You use needles to inject street drugs.  You live with someone who has hepatitis B.  You have had sex with someone who has hepatitis B.  You get hemodialysis treatment.  You take certain medicines for conditions, including cancer, organ transplantation, and autoimmune conditions. Hepatitis C  Blood testing is recommended for:  Everyone born from [birth year] through 1964.  Anyone with known risk factors for hepatitis C. Sexually transmitted infections (STIs)  You should be screened for sexually transmitted infections (STIs) including gonorrhea and chlamydia if:  You are sexually active and are younger than 63 years of age.  You are older than 63 years of age and your health care provider tells you that you are at risk for this type of infection.  Your sexual activity has changed since you were  last screened and you are at an increased risk for chlamydia or gonorrhea. Ask your health care provider if you are at risk.  If you do not have HIV, but are at risk, it may be recommended that you take a prescription medicine daily to prevent HIV infection. This is called pre-exposure prophylaxis (PrEP). You are considered at risk if:  You are sexually active and do not regularly  use condoms or know the HIV status of your partner(s).  You take drugs by injection.  You are sexually active with a partner who has HIV. Talk with your health care provider about whether you are at high risk of being infected with HIV. If you choose to begin PrEP, you should first be tested for HIV. You should then be tested every 3 months for as long as you are taking PrEP.  PREGNANCY   If you are premenopausal and you may become pregnant, ask your health care provider about preconception counseling.  If you may become pregnant, take 400 to 800 micrograms (mcg) of folic acid every day.  If you want to prevent pregnancy, talk to your health care provider about birth control (contraception). OSTEOPOROSIS AND MENOPAUSE   Osteoporosis is a disease in which the bones lose minerals and strength with aging. This can result in serious bone fractures. Your risk for osteoporosis can be identified using a bone density scan.  If you are 11 years of age or older, or if you are at risk for osteoporosis and fractures, ask your health care provider if you should be screened.  Ask your health care provider whether you should take a calcium or vitamin D supplement to lower your risk for osteoporosis.  Menopause may have certain physical symptoms and risks.  Hormone replacement therapy may reduce some of these symptoms and risks. Talk to your health care provider about whether hormone replacement therapy is right for you.  HOME CARE INSTRUCTIONS   Schedule regular health, dental, and eye exams.  Stay current with your  immunizations.   Do not use any tobacco products including cigarettes, chewing tobacco, or electronic cigarettes.  If you are pregnant, do not drink alcohol.  If you are breastfeeding, limit how much and how often you drink alcohol.  Limit alcohol intake to no more than 1 drink per day for nonpregnant women. One drink equals 12 ounces of beer, 5 ounces of wine, or 1 ounces of hard liquor.  Do not use street drugs.  Do not share needles.  Ask your health care provider for help if you need support or information about quitting drugs.  Tell your health care provider if you often feel depressed.  Tell your health care provider if you have ever been abused or do not feel safe at home.   This information is not intended to replace advice given to you by your health care provider. Make sure you discuss any questions you have with your health care provider.   Document Released: 06/06/2011 Document Revised: 12/12/2014 Document Reviewed: 10/23/2013 Elsevier Interactive Patient Education [employer].

## 2015-10-02 NOTE — Progress Notes (Signed)
Subjective:    Patient ID: Marissa Gibbs, female    DOB: 24-Dec-1950, 64 y.o.   MRN: [id number]  HPI  Patient presents to the office for complete physical.  Hypothyroid:  Patient has had a approximately 4 cm thyroid cyst removal in June 2016. Patient states pathology was benign. She was placed on 125 g of Synthroid , with normal TSH on follow-up. Patient reports her calcium was also checked at that time and was told it was normal. This all occurred within a few weeks after her surgery. Patient states she does not think that her levels have been tested since at summertime. Patient denies any heart palpitations, constipation, diarrhea, flushing.   Elevated BP:  Discussed patient has had borderline blood pressures over her last few office visits. Patient states that it occurs when she comes to the doctor's office, but that her blood pressures are normal, and her husband checks them.  She is unable to providing any blood pressure reading examples, and she is uncertain what normal blood pressure parameters are. She does state that she eats quite a bit of salt , does not exercise routinely.  She denies any chest pain, shortness of breath, dizziness, visual changes, headaches or lower extremity edema.  Health maintenance:  Colonoscopy: "long time ago" , normal, no fhx.  Mammogram: 2015 UTD, No FHx, scheduled in 2 weeks Cervical cancer screening: 2015 UTD, normal, negative HPV Immunizations: flu indicated, Tdap UTD,  Zostavax indicated , pneumonia vaccination at age 55. Infectious disease screening: Hep c and HIV indicated   Past Medical History  Diagnosis Date  . Hypothyroidism, postsurgical   . Allergy    No Known Allergies Past Surgical History  Procedure Laterality Date  . Endometrial biopsy  12/1999    Benign  . Colonoscopy  06/2007    Normal  . Thyroidectomy  07/02/15    Dr. Kelli Churn     Family History  Problem Relation Age of Onset  . Arthritis Mother   . Breast cancer  Mother 26  . Hypertension Mother   . Diabetes Mother   . Heart attack Father   . Diabetes Father   . Alcohol abuse Father   . Thyroid disease Neg Hx    Social History   Social History  . Marital Status: Married    Spouse Name: N/A  . Number of Children: N/A  . Years of Education: N/A   Occupational History  . Not on file.   Social History Main Topics  . Smoking status: Never Smoker   . Smokeless tobacco: Never Used  . Alcohol Use: No  . Drug Use: No  . Sexual Activity: Not on file   Other Topics Concern  . Not on file   Social History Narrative   Married, one biologic daughter and one stepson.   Education: 12 grade.   Occupation: Woodworker Tax inspector).   No T/A/Ds.           Review of Systems Negative, with the exception of above mentioned in HPI    Objective:   Physical Exam BP 155/83 mmHg  Pulse 78  Temp(Src) 97.8 F (36.6 C) (Oral)  Resp 20  Ht 5' (1.524 m)  Wt 173 lb 12 oz (78.812 kg)  BMI 33.93 kg/m2  SpO2 96% Gen: Afebrile. No acute distress.  Nontoxic in appearance, well-developed, well-nourished, Caucasian female, very pleasant, mildly obese. HENT: AT. [place]. Bilateral TM visualized and normal in appearance. MMM. Bilateral nares  Without erythema or swelling. Throat without erythema  or exudates.  Eyes:Pupils Equal Round Reactive to light, Extraocular movements intact,  Conjunctiva without redness, discharge or icterus. Neck/lymp/endocrine: Supple, no lymphadenopathy,  Scar formation inferior thyroid secondary to  Thyroid cyst removal. CV: RRR  No murmur,  noedema, +2/4 P posterior tibialis pulses Chest: CTAB, no wheeze or crackles Abd: Soft.   Mildly obese. NTND. BS  present.  no Masses palpated.  MSK: no obvious deformities, full range of motion Skin:  no rashes, purpura or petechiae.  Neuro:  Normal gait. PERLA. EOMi. Alert. Oriented 3  Psych: Normal affect, dress and demeanor. Normal speech. Normal thought content and judgment..        Assessment & Plan:  1. Elevated blood pressure reading without diagnosis of hypertension -  Patient was encouraged to plenty of fresh fruits and vegetables, lean meats. Avoid added salt, and start to monitor the salt content in her diet. It sounds as if she enjoys her salt. -  Patient to monitor her blood pressure in the outpatient setting, at least 3 blood pressure readings per week. Patient was given normal parameters, and was advised that she sees blood pressures above 301 systolic or above 80 diastolic consistently, then she is to make an appointment sooner to discuss start blood pressure medications. - CBC w/Diff - Comp Met (CMET) - HgB A1c  2. Hypothyroidism, postsurgical -  Patient with recent removal of thyroid cyst, with initiation of thyroid hormone. TSH, calcium levels have been normal immediately following surgery. However these have not been checked since approximately June per patient. We'll recheck TSH and T4 along with CMP today, if everything and normal parameters, will follow-up TSH yearly unless patient becomes symptomatic. - TSH - T4, free  3. Elevated LDL cholesterol level - Lipid panel -  Patient currently not on medications, encouraged behavior medications (see above). Discussed addition of fish oil if able to tolerate.  4. Colon cancer screening -  Patient does not recall when her colonoscopy was but states it was "many years ago ", she states it was normal at that time. There is no family history of colon cancer. However patient declines colonoscopy screening. Discussed colon cancer in screening is with her today, patient is amendable to at least do a Hemoccult. - POC Hemoccult Bld/Stl (3-Cd Home Screen); Future  5. Need for prophylactic vaccination and inoculation against influenza - Flu Vaccine QUAD 36+ mos PF IM (Fluarix & Fluzone Quad PF) 6. Health maintenance:  Colonoscopy: "long time ago" , normal, no fhx.  Patient declines colonoscopy today, amendable to  Hemoccult. Mammogram: 2015 UTD, No FHx, scheduled in 2 weeks Cervical cancer screening: 2015 UTD, normal, negative HPV Immunizations: flu  Administered today, Tdap UTD,  Zostavax indicated-->  declined , pneumonia vaccination age 39 Infectious disease screening: Hep c and HIV indicated-->  Declined   follow-up in 6 months, unless blood pressures are above goal , or lab results are abnormal.

## 2015-10-02 NOTE — Telephone Encounter (Signed)
Please call pt: - her thyroid hormone is over replaced. We will need to lower her dose. I have called in a the new dose, she needs to have thyroid retested in 8 weeks (order placed) by lab appt. - Her a1c is 5.8, which is prediabetic and she needs to make dietary changes (fresh fruit/veggies, lean meats, low sugar/carbs) and exercise more. This should be retested in 6 months with provider appt and a1c before rooming.   - all other labs were good.

## 2015-10-06 DIAGNOSIS — R928 Other abnormal and inconclusive findings on diagnostic imaging of breast: Secondary | ICD-10-CM

## 2015-10-06 HISTORY — DX: Other abnormal and inconclusive findings on diagnostic imaging of breast: R92.8

## 2015-10-06 NOTE — Telephone Encounter (Signed)
Spoke with patient reviewed lab results and instructions. Patient states she will call back to schedule appt for Lab visit for TSH after she checks her schedule. Patient states her insurance runs out at end of December so she will schedule before then. Patient verbalized understanding of all instructions.

## 2015-10-06 NOTE — Telephone Encounter (Signed)
Left message for patient to call back to review labs and instructions.

## 2015-10-12 ENCOUNTER — Other Ambulatory Visit: Payer: Self-pay | Admitting: Family Medicine

## 2015-10-12 ENCOUNTER — Telehealth: Payer: Self-pay | Admitting: Family Medicine

## 2015-10-12 ENCOUNTER — Other Ambulatory Visit: Payer: 59

## 2015-10-12 DIAGNOSIS — Z1211 Encounter for screening for malignant neoplasm of colon: Secondary | ICD-10-CM

## 2015-10-12 LAB — HEMOCCULT SLIDES (X 3 CARDS)
Fecal Occult Blood: NEGATIVE
OCCULT 1: NEGATIVE
OCCULT 2: NEGATIVE
OCCULT 3: NEGATIVE
OCCULT 4: NEGATIVE
OCCULT 5: NEGATIVE

## 2015-10-12 NOTE — Telephone Encounter (Signed)
Pls notify pt that her hemoccults came back showing NO BLOOD.  Reassure her.-thx

## 2015-10-12 NOTE — Telephone Encounter (Signed)
Left detailed message on home vm, okay per DRP.

## 2015-10-15 ENCOUNTER — Ambulatory Visit
Admission: RE | Admit: 2015-10-15 | Discharge: 2015-10-15 | Disposition: A | Discharge status: Home / Self Care | Payer: 59 | Source: Ambulatory Visit

## 2015-10-15 DIAGNOSIS — Z1231 Encounter for screening mammogram for malignant neoplasm of breast: Secondary | ICD-10-CM

## 2015-10-20 ENCOUNTER — Other Ambulatory Visit: Payer: Self-pay | Admitting: Family Medicine

## 2015-10-20 DIAGNOSIS — R928 Other abnormal and inconclusive findings on diagnostic imaging of breast: Secondary | ICD-10-CM

## 2015-10-26 ENCOUNTER — Ambulatory Visit
Admission: RE | Admit: 2015-10-26 | Discharge: 2015-10-26 | Disposition: A | Discharge status: Home / Self Care | Payer: 59 | Source: Ambulatory Visit | Attending: Family Medicine | Admitting: Family Medicine

## 2015-10-26 DIAGNOSIS — R928 Other abnormal and inconclusive findings on diagnostic imaging of breast: Secondary | ICD-10-CM

## 2015-10-26 HISTORY — DX: Other abnormal and inconclusive findings on diagnostic imaging of breast: R92.8

## 2015-10-28 ENCOUNTER — Other Ambulatory Visit: Payer: 59

## 2015-11-01 ENCOUNTER — Encounter: Payer: Self-pay | Admitting: Family Medicine

## 2015-11-16 ENCOUNTER — Other Ambulatory Visit (INDEPENDENT_AMBULATORY_CARE_PROVIDER_SITE_OTHER): Payer: 59

## 2015-11-16 ENCOUNTER — Telehealth: Payer: Self-pay | Admitting: Family Medicine

## 2015-11-16 DIAGNOSIS — E0590 Thyrotoxicosis, unspecified without thyrotoxic crisis or storm: Secondary | ICD-10-CM

## 2015-11-16 LAB — TSH: TSH: 0.7 u[IU]/mL (ref 0.35–4.50)

## 2015-11-16 NOTE — Telephone Encounter (Signed)
Pt came in for TSH today and was requesting a 90 day supply of thyroid medication when blood work comes back.  FYI

## 2015-11-17 ENCOUNTER — Telehealth: Payer: Self-pay | Admitting: Family Medicine

## 2015-11-17 MED ORDER — LEVOTHYROXINE SODIUM 112 MCG PO TABS: 112.0000 ug | ORAL_TABLET | Freq: Every day | ORAL | Status: DC

## 2015-11-17 NOTE — Telephone Encounter (Signed)
Left message with results and information on patient voice mail per DPR. 

## 2015-11-17 NOTE — Telephone Encounter (Signed)
Please call pt, her thyroid is now functioning normal on this dose of medication. I have called in 90d refills (for 1 year).

## 2015-12-06 DIAGNOSIS — I10 Essential (primary) hypertension: Secondary | ICD-10-CM

## 2015-12-06 HISTORY — DX: Essential (primary) hypertension: I10

## 2016-04-27 ENCOUNTER — Ambulatory Visit (INDEPENDENT_AMBULATORY_CARE_PROVIDER_SITE_OTHER): Payer: No Typology Code available for payment source | Admitting: Family Medicine

## 2016-04-27 ENCOUNTER — Encounter: Payer: Self-pay | Admitting: Family Medicine

## 2016-04-27 VITALS — BP 150/95 | HR 101 | Temp 98.3°F | Resp 16 | Ht 60.0 in | Wt 190.8 lb

## 2016-04-27 DIAGNOSIS — A09 Infectious gastroenteritis and colitis, unspecified: Secondary | ICD-10-CM

## 2016-04-27 DIAGNOSIS — E039 Hypothyroidism, unspecified: Secondary | ICD-10-CM | POA: Diagnosis not present

## 2016-04-27 NOTE — Progress Notes (Signed)
Pre visit review using our clinic review tool, if applicable. No additional management support is needed unless otherwise documented below in the visit note. 

## 2016-04-27 NOTE — Progress Notes (Signed)
OFFICE VISIT  04/27/2016   CC:  Chief Complaint  Patient presents with  . Diarrhea    x 9 days  . Abdominal Pain    LLQ x 3 days   HPI:    Patient is a 65 y.o. Caucasian female who presents for diarrhea.  Onset 8-9 days ago, watery stool.   No n/v.  She has been able to eat and drink normally--even drank more to try to keep up with her fluid loss from diarrhea.  Today she has felt worse, more fatigued, some clammy feeling.  The volume of her stools has decreased last several days, some substance to it.  Has only had one bm today.  No recent abnormal/new/odd food eaten recently.  Some sick contacts in church but her husband is not ill.  No recent travel.  No recent abx. Has had some painless BRBPR--on toilet tissue when wiping.  Describes som R LQ gassiness pressure recently.   No abd cramping or aching throughout this entire illness.  She did have a bit of nocturnal diarrhea.    ROS: no rash, no HA, no ST, no URI or cough or SOB. Face feels hot today. She checked her bp last night and it was normal.  Past Medical History  Diagnosis Date  . Hypothyroidism, postsurgical   . Allergy   . Abnormal mammogram of right breast 10/2015    Benign-appearing cysts on f/u diagnostic mammo and ultrasound: recommended stay on annual mammogram screening schedule    Past Surgical History  Procedure Laterality Date  . Endometrial biopsy  12/1999    Benign  . Colonoscopy  06/2007    Normal Sharlett Iles)  . Thyroidectomy  07/02/15    Dr. Kelli Churn  . Dexa  08/2006    T -0.3  . Tubal ligation  03/03/89    Outpatient Prescriptions Prior to Visit  Medication Sig Dispense Refill  . calcium-vitamin D (OSCAL WITH D) 250-125 MG-UNIT per tablet Take 1 tablet by mouth daily.    . hydrocortisone valerate ointment (WEST-CORT) 0.2 % Apply with a Q tip 1-2 times per day as needed for dermatitis in ear canal 15 g 2  . levothyroxine (SYNTHROID, LEVOTHROID) 112 MCG tablet Take 1 tablet (112 mcg total) by  mouth daily before breakfast. 90 tablet 3  . loratadine (CLARITIN) 10 MG tablet Take 10 mg by mouth.    . Multiple Vitamins-Minerals (MULTIVITAMIN PO) Take by mouth.     No facility-administered medications prior to visit.    No Known Allergies  ROS As per HPI  PE: Blood pressure 150/95, pulse 101, temperature 98.3 F (36.8 C), temperature source Oral, resp. rate 16, height 5' (1.524 m), weight 190 lb 12 oz (86.524 kg), SpO2 97 %.  Repeat bp after visit was 150/95 Gen: Alert, well appearing. Smiling/conversant.  Patient is oriented to person, place, time, and situation. [id number]: no injection, icteris, swelling, or exudate.  EOMI, PERRLA. Mouth: lips without lesion/swelling.  Oral mucosa pink and moist. Oropharynx without erythema, exudate, or swelling.  CV: RRR, no m/r/g.  Rate 85 by me. LUNGS: CTA bilat, nonlabored resps, good aeration in all lung fields. ABD: soft, NT, ND, BS normal in upper quadrants but diminished in lower quadrants.  No hepatospenomegaly or mass.  No bruits. EXT: no clubbing, cyanosis, or edema.    LABS:  None today  Lab Results  Component Value Date   TSH 0.70 11/16/2015   IMPRESSION AND PLAN:  1) Diarrhea, presumed infectious. Still with some malaise that seems  to be the worst of things at this point. I advised her to NOT take any anti-diarrhea med. Continue with aggressive oral hydration. Will check stool sample, and I drew a CBC w/diff and CMET today.  An After Visit Summary was printed and given to the patient.  FOLLOW UP: Return in about 5 days (around 05/02/2016) for f/u diarrhea.  Signed:  Crissie Sickles, MD           04/27/2016

## 2016-04-28 LAB — COMPREHENSIVE METABOLIC PANEL
ALBUMIN: 4.4 g/dL (ref 3.5–5.2)
ALT: 9 U/L (ref 0–35)
AST: 11 U/L (ref 0–37)
Alkaline Phosphatase: 58 U/L (ref 39–117)
BILIRUBIN TOTAL: 0.4 mg/dL (ref 0.2–1.2)
BUN: 8 mg/dL (ref 6–23)
CALCIUM: 9.9 mg/dL (ref 8.4–10.5)
CO2: 28 meq/L (ref 19–32)
CREATININE: 0.63 mg/dL (ref 0.40–1.20)
Chloride: 103 mEq/L (ref 96–112)
GFR: 101.29 mL/min (ref >60.00)
Glucose, Bld: 106 mg/dL — ABNORMAL HIGH (ref 70–99)
Potassium: 4.3 mEq/L (ref 3.5–5.1)
SODIUM: 138 meq/L (ref 135–145)
TOTAL PROTEIN: 7.1 g/dL (ref 6.0–8.3)

## 2016-04-28 LAB — CBC WITH DIFFERENTIAL/PLATELET
BASOS ABS: 0 10*3/uL (ref 0.0–0.1)
BASOS PCT: 0.3 % (ref 0.0–3.0)
EOS ABS: 0.1 10*3/uL (ref 0.0–0.7)
Eosinophils Relative: 0.7 % (ref 0.0–5.0)
HEMATOCRIT: 41.8 % (ref 36.0–46.0)
HEMOGLOBIN: 14 g/dL (ref 12.0–15.0)
LYMPHS PCT: 17.3 % (ref 12.0–46.0)
Lymphs Abs: 1.8 10*3/uL (ref 0.7–4.0)
MCHC: 33.6 g/dL (ref 30.0–36.0)
MCV: 89.1 fl (ref 78.0–100.0)
Monocytes Absolute: 0.5 10*3/uL (ref 0.1–1.0)
Monocytes Relative: 5.1 % (ref 3.0–12.0)
Neutro Abs: 8.2 10*3/uL — ABNORMAL HIGH (ref 1.4–7.7)
Neutrophils Relative %: 76.6 % (ref 43.0–77.0)
Platelets: 253 10*3/uL (ref 150.0–400.0)
RBC: 4.69 Mil/uL (ref 3.87–5.11)
RDW: 12.7 % (ref 11.5–15.5)
WBC: 10.7 10*3/uL — AB (ref 4.0–10.5)

## 2016-04-28 LAB — TSH: TSH: 0.71 u[IU]/mL (ref 0.35–4.50)

## 2016-04-30 LAB — CLOSTRIDIUM DIFFICILE BY PCR: CDIFFPCR: NOT DETECTED

## 2016-05-03 ENCOUNTER — Encounter: Payer: Self-pay | Admitting: Family Medicine

## 2016-05-03 ENCOUNTER — Ambulatory Visit (INDEPENDENT_AMBULATORY_CARE_PROVIDER_SITE_OTHER): Payer: No Typology Code available for payment source | Admitting: Family Medicine

## 2016-05-03 VITALS — BP 148/112 | HR 106 | Temp 98.3°F | Resp 16 | Ht 60.0 in | Wt 189.0 lb

## 2016-05-03 DIAGNOSIS — A09 Infectious gastroenteritis and colitis, unspecified: Secondary | ICD-10-CM | POA: Diagnosis not present

## 2016-05-03 DIAGNOSIS — R030 Elevated blood-pressure reading, without diagnosis of hypertension: Secondary | ICD-10-CM | POA: Diagnosis not present

## 2016-05-03 LAB — ROTAVIRUS ANTIGEN, STOOL: ROTAVIRUS: NOT DETECTED

## 2016-05-03 LAB — FECAL LACTOFERRIN, QUANT: Lactoferrin: NEGATIVE

## 2016-05-03 NOTE — Progress Notes (Signed)
OFFICE VISIT  05/03/2016   CC:  Chief Complaint  Patient presents with  . Follow-up    diarrhea     HPI:    Patient is a 65 y.o. Caucasian female who presents for 6 day f/u diarrhea illness. Feeling much improved.  Appetite still down. Having avg 1 stool per day now, partially loose.  No pus or blood in stool.  +BRBPR on toilet tissue.  No anal pain. No fevers.  Drinking gatorade and water well. Her bp has been stage I systolic and normal diastolic at home per her report today. She tells me of a string of financial woes that have struck her and her husband in the last 6 mo.  She admits that her nerves are "shot".   Past Medical History  Diagnosis Date  . Hypothyroidism, postsurgical   . Allergy   . Abnormal mammogram of right breast 10/2015    Benign-appearing cysts on f/u diagnostic mammo and ultrasound: recommended stay on annual mammogram screening schedule    Past Surgical History  Procedure Laterality Date  . Endometrial biopsy  12/1999    Benign  . Colonoscopy  06/2007    Normal Shar[facility] Iles)  . Thyroidectomy  07/02/15    Dr. Kelli Churn  . Dexa  08/2006    T -0.3  . Tubal ligation  03/03/89    Outpatient Prescriptions Prior to Visit  Medication Sig Dispense Refill  . calcium-vitamin D (OSCAL WITH D) 250-125 MG-UNIT per tablet Take 1 tablet by mouth daily.    . hydrocortisone valerate ointment (WEST-CORT) 0.2 % Apply with a Q tip 1-2 times per day as needed for dermatitis in ear canal 15 g 2  . levothyroxine (SYNTHROID, LEVOTHROID) 112 MCG tablet Take 1 tablet (112 mcg total) by mouth daily before breakfast. 90 tablet 3  . loratadine (CLARITIN) 10 MG tablet Take 10 mg by mouth.    . Multiple Vitamins-Minerals (MULTIVITAMIN PO) Take by mouth.     No facility-administered medications prior to visit.    No Known Allergies  ROS As per HPI  PE: Blood pressure 148/112, pulse 106, temperature 98.3 F (36.8 C), temperature source Oral, resp. rate 16, height 5'  (1.524 m), weight 189 lb (85.73 kg), SpO2 98 %. Repeat bp 148/110 with manual cuff Gen: Alert, well appearing.  Patient is oriented to person, place, time, and situation. [id number]: no injection, icteris, swelling, or exudate.  EOMI, PERRLA. Mouth: lips without lesion/swelling.  Oral mucosa pink and moist. Oropharynx without erythema, exudate, or swelling.  CV: Regular rhythm, rate 105, no m/r/g.   LUNGS: CTA bilat, nonlabored resps, good aeration in all lung fields.   LABS:  Lab Results  Component Value Date   WBC 10.7* 04/27/2016   HGB 14.0 04/27/2016   HCT 41.8 04/27/2016   MCV 89.1 04/27/2016   PLT 253.0 04/27/2016   Lab Results  Component Value Date   TSH 0.71 04/27/2016   C diff pcr NEG, stool culture NEG so far.  All other stool studies pending at this time.  IMPRESSION AND PLAN:  1) Diarrhea, presumed infectious/viral. C diff neg.  Other stool studies pending. She is clinically MUCH improved.  Continue to push fluids and advance diet as tolerated.  2) Elevated blood pressure w/out dx of HTN: monitor at this time. Instructions: Check your blood pressure and heart rate once daily and write these numbers down. Call the office and report these numbers to [facility] in about a month.  An After Visit Summary was printed and  given to the patient.  FOLLOW UP: Return in about 4 weeks (around 05/31/2016) for f/u elevated blood pressures.  Signed:  Crissie Sickles, MD           05/03/2016

## 2016-05-03 NOTE — Patient Instructions (Signed)
Check your blood pressure and heart rate once daily and write these numbers down. Call the office and report these numbers to [facility].

## 2016-05-03 NOTE — Progress Notes (Signed)
Pre visit review using our clinic review tool, if applicable. No additional management support is needed unless otherwise documented below in the visit note. 

## 2016-05-04 LAB — STOOL CULTURE

## 2016-05-05 LAB — GIARDIA/CRYPTOSPORIDIUM (EIA)

## 2016-06-03 ENCOUNTER — Telehealth: Payer: Self-pay | Admitting: Family Medicine

## 2016-06-03 NOTE — Telephone Encounter (Signed)
Patient calling to report BP's  05/05/16 152/81, P 81 05/06/16 149/85, P 85 05/07/16 145/80, P 76 05/08/16 149/82, P 71 05/09/16 142/82, P 82 05/10/16 131/90, P 78 05/11/16 110/86, P 75 05/12/16 140/84, P 82 05/13/16 150/80, P 82 05/14/16 147/83, P 82 05/15/16 101/73, P 78 05/16/16 140/78, P 81 05/17/16 140/91, P 78 05/18/16 101/73, P 78 05/19/16 140/83, P 82 05/20/16 140/91, P 78 05/21/16 ------------------ 05/22/16 148/83, P 82 05/23/16 141/90, P 75 05/24/16 132/87, P 72 05/25/16 130/82, P 76 05/26/16 131/90, P 78 05/27/16 140/93, P 74 05/28/16 131/76, P 81 05/29/16 137/84, P 83 05/30/16 132/88, P 72 05/31/16 135/76, P 82 06/01/16 ---------------- 06/02/16  --------------- 06/03/16 144/72, P 79  Please advise.

## 2016-06-04 MED ORDER — LOSARTAN POTASSIUM 50 MG PO TABS: 50.0000 mg | ORAL_TABLET | Freq: Every day | ORAL | Status: DC

## 2016-06-04 NOTE — Telephone Encounter (Signed)
Pls notify pt that her blood pressure is just a little bit high and I want to start a low dose of a generic bp med---Losartan 50mg  once daily.  I've eRx'd this for her already. Continue with home BP monitoring----tell her she did a great job. F/u in office in 1 mo to review them and see how she's doing on the med.-thx

## 2016-06-06 NOTE — Telephone Encounter (Signed)
Pt advised and voiced understanding.  She stated that due to recent medical cost for her husband she may not be able to start the new BP medication but will contact her pharmacy to see how much it will cost and she will pick it up if she can afford it.

## 2016-06-29 ENCOUNTER — Other Ambulatory Visit: Payer: Self-pay | Admitting: Family Medicine

## 2016-06-29 ENCOUNTER — Telehealth: Payer: Self-pay | Admitting: *Deleted

## 2016-06-29 MED ORDER — LISINOPRIL 10 MG PO TABS: 10.0000 mg | ORAL_TABLET | Freq: Every day | ORAL | 6 refills | Status: DC

## 2016-06-29 NOTE — Progress Notes (Signed)
A user error has taken place: encounter opened in error, closed for administrative reasons.

## 2016-06-29 NOTE — Telephone Encounter (Signed)
Tell pt I don't have any idea what her insurance plan has on it's formulary, so I'm just taking a guess in order to try another med and see if it is any cheaper.  I eRx lisinopril 10mg  to take once daily instead of the losartan.-thx

## 2016-06-29 NOTE — Telephone Encounter (Signed)
Pt LMOM on 06/29/16 at 9:19am stating that she had been taking the losartan for her BP and it has come down. Readings run 133-141/70-90. She stated that she is having a hard time affording this medication. She stated that for a 30 day supply it cost her $40. She wants to know if there is something else she can take that is cheaper. Please advise. Thanks.

## 2016-06-30 NOTE — Telephone Encounter (Signed)
Pt advised and voiced understanding.   

## 2016-10-10 ENCOUNTER — Ambulatory Visit (INDEPENDENT_AMBULATORY_CARE_PROVIDER_SITE_OTHER): Payer: No Typology Code available for payment source | Admitting: Family Medicine

## 2016-10-10 ENCOUNTER — Encounter: Payer: Self-pay | Admitting: Family Medicine

## 2016-10-10 VITALS — BP 145/80 | HR 83 | Temp 97.1°F | Resp 16 | Wt 189.8 lb

## 2016-10-10 DIAGNOSIS — I10 Essential (primary) hypertension: Secondary | ICD-10-CM | POA: Diagnosis not present

## 2016-10-10 DIAGNOSIS — Z23 Encounter for immunization: Secondary | ICD-10-CM

## 2016-10-10 DIAGNOSIS — E890 Postprocedural hypothyroidism: Secondary | ICD-10-CM | POA: Diagnosis not present

## 2016-10-10 MED ORDER — LISINOPRIL 10 MG PO TABS: 10.0000 mg | ORAL_TABLET | Freq: Every day | ORAL | 3 refills | Status: DC

## 2016-10-10 MED ORDER — LEVOTHYROXINE SODIUM 112 MCG PO TABS: 112.0000 ug | ORAL_TABLET | Freq: Every day | ORAL | 3 refills | Status: DC

## 2016-10-10 NOTE — Progress Notes (Signed)
OFFICE VISIT  10/10/2016   CC:  Chief Complaint  Patient presents with  . Follow-up    HTN, Refills on medication   HPI:    Patient is a 64 y.o. Caucasian female who presents for f/u hypothyroidism, HTN. BP's at home 120s-130s/70s.  Compliant with med.  Takes thyroid med daily, correctly.  Feeling fine, no acute complaints.  ROS: no palp's, no HA's, no dizziness, no CP, no SOB.   Past Medical History:  Diagnosis Date  . Abnormal mammogram of right breast 10/2015   Benign-appearing cysts on f/u diagnostic mammo and ultrasound: recommended stay on annual mammogram screening schedule  . Allergy   . Hypothyroidism, postsurgical     Past Surgical History:  Procedure Laterality Date  . COLONOSCOPY  06/2007   Normal Sharlett Iles)  . DEXA  08/2006   T -0.3  . ENDOMETRIAL BIOPSY  12/1999   Benign  . THYROIDECTOMY  07/02/15   Dr. Kelli Churn  . TUBAL LIGATION  03/03/89    Outpatient Medications Prior to Visit  Medication Sig Dispense Refill  . calcium-vitamin D (OSCAL WITH D) 250-125 MG-UNIT per tablet Take 1 tablet by mouth daily.    . hydrocortisone valerate ointment (WEST-CORT) 0.2 % Apply with a Q tip 1-2 times per day as needed for dermatitis in ear canal 15 g 2  . loratadine (CLARITIN) 10 MG tablet Take 10 mg by mouth.    . levothyroxine (SYNTHROID, LEVOTHROID) 112 MCG tablet Take 1 tablet (112 mcg total) by mouth daily before breakfast. 90 tablet 3  . lisinopril (PRINIVIL,ZESTRIL) 10 MG tablet Take 1 tablet (10 mg total) by mouth daily. 30 tablet 6  . Multiple Vitamins-Minerals (MULTIVITAMIN PO) Take by mouth.    . losartan (COZAAR) 50 MG tablet Take 1 tablet (50 mg total) by mouth daily. (Patient not taking: Reported on 10/10/2016) 30 tablet 2   No facility-administered medications prior to visit.     No Known Allergies  ROS As per HPI  PE: Blood pressure (!) 145/80, pulse 83, temperature 97.1 F (36.2 C), temperature source Temporal, resp. rate 16, weight 189  lb 12.8 oz (86.1 kg), SpO2 99 %. Gen: Alert, well appearing.  Patient is oriented to person, place, time, and situation. AFFECT: pleasant, lucid thought and speech. CV: RRR, no m/r/g.   LUNGS: CTA bilat, nonlabored resps, good aeration in all lung fields. EXT: no clubbing, cyanosis, or edema.    LABS:  Lab Results  Component Value Date   TSH 0.71 04/27/2016   Lab Results  Component Value Date   WBC 10.7 (H) 04/27/2016   HGB 14.0 04/27/2016   HCT 41.8 04/27/2016   MCV 89.1 04/27/2016   PLT 253.0 04/27/2016   Lab Results  Component Value Date   CREATININE 0.63 04/27/2016   BUN 8 04/27/2016   NA 138 04/27/2016   K 4.3 04/27/2016   CL 103 04/27/2016   CO2 28 04/27/2016   Lab Results  Component Value Date   ALT 9 04/27/2016   AST 11 04/27/2016   ALKPHOS 58 04/27/2016   BILITOT 0.4 04/27/2016   Lab Results  Component Value Date   CHOL 193 10/02/2015   Lab Results  Component Value Date   HDL 50.10 10/02/2015   Lab Results  Component Value Date   LDLCALC 129 (H) 10/02/2015   Lab Results  Component Value Date   TRIG 71.0 10/02/2015   Lab Results  Component Value Date   CHOLHDL 4 10/02/2015   Lab Results  Component  Value Date   HGBA1C 5.8 10/02/2015    IMPRESSION AND PLAN:  1) HTN: The current medical regimen is effective;  continue present plan and medications. Lytes/cr good 6 mo ago. Plan recheck labs 6 mo. RF'd bp med today.  2) Hypothyroidism: TSH normal 6 mo ago. Plan recheck 6 mo. RF'd med today.  Flu vaccine today.  An After Visit Summary was printed and given to the patient.  FOLLOW UP: Return in about 6 months (around 04/09/2017) for annual CPE (fasting).  Signed:  Crissie Sickles, MD           10/10/2016

## 2016-10-10 NOTE — Progress Notes (Signed)
Pre visit review using our clinic review tool, if applicable. No additional management support is needed unless otherwise documented below in the visit note. 

## 2016-10-10 NOTE — Addendum Note (Signed)
Addended by: Gordy Councilman on: 10/10/2016 08:49 AM   Modules accepted: Orders

## 2016-11-21 ENCOUNTER — Telehealth: Payer: Self-pay | Admitting: Family Medicine

## 2016-11-21 NOTE — Telephone Encounter (Signed)
OK to change levothyroxine brand.-thx

## 2016-11-21 NOTE — Telephone Encounter (Signed)
Marissa Gibbs from [facility] needs to get authorization to change levothyroxine brand, it is going from [place] to [place] brand.  Pharmacist states that have to get authorization even though they can't change anything, but it's just to make you aware her thyroid levels can change.  Please advise.

## 2016-11-22 NOTE — Telephone Encounter (Signed)
Pharmacy aware

## 2017-04-25 ENCOUNTER — Encounter: Payer: Self-pay | Admitting: Internal Medicine

## 2017-11-10 ENCOUNTER — Ambulatory Visit (INDEPENDENT_AMBULATORY_CARE_PROVIDER_SITE_OTHER): Payer: Medicare HMO | Admitting: Family Medicine

## 2017-11-10 ENCOUNTER — Other Ambulatory Visit: Payer: Self-pay

## 2017-11-10 ENCOUNTER — Encounter: Payer: Self-pay | Admitting: Family Medicine

## 2017-11-10 VITALS — BP 166/81 | HR 84 | Temp 98.0°F | Resp 16 | Wt 185.2 lb

## 2017-11-10 DIAGNOSIS — Z23 Encounter for immunization: Secondary | ICD-10-CM | POA: Diagnosis not present

## 2017-11-10 DIAGNOSIS — E890 Postprocedural hypothyroidism: Secondary | ICD-10-CM

## 2017-11-10 DIAGNOSIS — N760 Acute vaginitis: Secondary | ICD-10-CM | POA: Diagnosis not present

## 2017-11-10 DIAGNOSIS — R05 Cough: Secondary | ICD-10-CM | POA: Diagnosis not present

## 2017-11-10 DIAGNOSIS — B9689 Other specified bacterial agents as the cause of diseases classified elsewhere: Secondary | ICD-10-CM | POA: Diagnosis not present

## 2017-11-10 DIAGNOSIS — I10 Essential (primary) hypertension: Secondary | ICD-10-CM | POA: Diagnosis not present

## 2017-11-10 DIAGNOSIS — R058 Other specified cough: Secondary | ICD-10-CM

## 2017-11-10 MED ORDER — METRONIDAZOLE 500 MG PO TABS: ORAL_TABLET | ORAL | 0 refills | Status: DC

## 2017-11-10 MED ORDER — PANTOPRAZOLE SODIUM 40 MG PO TBEC: 40.0000 mg | DELAYED_RELEASE_TABLET | Freq: Every day | ORAL | 3 refills | Status: DC

## 2017-11-10 MED ORDER — LISINOPRIL 20 MG PO TABS: 20.0000 mg | ORAL_TABLET | Freq: Every day | ORAL | 1 refills | Status: DC

## 2017-11-10 NOTE — Patient Instructions (Signed)
Elevate the head of your bed (ideally with 6 inch  bed blocks),  Smoking cessation, avoidance of late meals, excessive alcohol, and avoid fatty foods, chocolate, peppermint, colas, red wine, and acidic juices such as orange juice.  NO MINT OR MENTHOL PRODUCTS SO NO COUGH DROPS   USE SUGARLESS CANDY INSTEAD (Jolley ranchers or Stover's or Life Savers) or even ice chips will also do - the key is to swallow to prevent all throat clearing. NO OIL BASED VITAMINS - use powdered substitutes.    

## 2017-11-10 NOTE — Progress Notes (Signed)
OFFICE VISIT  11/10/2017   CC:  Chief Complaint  Patient presents with  . Annual Exam    Pt is fasting.     HPI:    Patient is a 66 y.o. Caucasian female who presents for HTN and post-operative hypothyroidism. I have not seen her in about 1 yr.  HTN: home measurements monthly show 160s/80s, HR 80s. Takes med daily.  Hypoth: takes levothyroxine q AM on empty stomach.  Takes other meds a few minutes later.  Describes having a "horrible" vaginal discharge--last couple months--yellow, odor that is "sour".   Thick consistency, not "cheesy".  Some itching.  Says it is NOT the same as past yeast infections she has had.  ROS: cough a couple months ago, bad x 2 days, then got a lot better with cough drops.  Still with some bothersome coughing at night after she lays down.  Feels postnasal drip, nasal congestion, runny nose. Having some nosebleeds lately. NO SOB, no wheezing, no fevers, no malaise. Denies GERD sx's.  Having lots of stress incontinence lately since coughing a lot, has to wear depends.  Past Medical History:  Diagnosis Date  . Abnormal mammogram of right breast 10/2015   Benign-appearing cysts on f/u diagnostic mammo and ultrasound: recommended stay on annual mammogram screening schedule  . Allergy   . Essential hypertension 2017  . Hypothyroidism, postsurgical     Past Surgical History:  Procedure Laterality Date  . COLONOSCOPY  06/2007   Normal Sharlett Iles)  . DEXA  08/2006   T -0.3  . ENDOMETRIAL BIOPSY  12/1999   Benign  . THYROIDECTOMY  07/02/15   Dr. Kelli Churn  . TUBAL LIGATION  03/03/89    Outpatient Medications Prior to Visit  Medication Sig Dispense Refill  . calcium-vitamin D (OSCAL WITH D) 250-125 MG-UNIT per tablet Take 1 tablet by mouth daily.    . hydrocortisone valerate ointment (WEST-CORT) 0.2 % Apply with a Q tip 1-2 times per day as needed for dermatitis in ear canal 15 g 2  . loratadine (CLARITIN) 10 MG tablet Take 10 mg by mouth.    .  Multiple Vitamins-Minerals (MULTIVITAMIN PO) Take by mouth.    Marland Kitchen lisinopril (PRINIVIL,ZESTRIL) 10 MG tablet Take 1 tablet (10 mg total) by mouth daily. 90 tablet 3  . levothyroxine (SYNTHROID, LEVOTHROID) 112 MCG tablet Take 1 tablet (112 mcg total) by mouth daily before breakfast. 90 tablet 3   No facility-administered medications prior to visit.     No Known Allergies  ROS As per HPI  PE: Blood pressure (!) 166/81, pulse 84, temperature 98 F (36.7 C), temperature source Oral, resp. rate 16, weight 185 lb 4 oz (84 kg), SpO2 99 %. Gen: Alert, well appearing.  Patient is oriented to person, place, time, and situation. AFFECT: pleasant, lucid thought and speech. ENT: Ears: EACs clear, normal epithelium.  TMs with good light reflex and landmarks bilaterally.  Eyes: no injection, icteris, swelling, or exudate.  EOMI, PERRLA. Nose: no drainage or turbinate edema/swelling.  No injection or focal lesion.  Mouth: lips without lesion/swelling.  Oral mucosa pink and moist.  Dentition intact and without obvious caries or gingival swelling.  Oropharynx without erythema, exudate, or swelling.  Neck - No masses or thyromegaly or limitation in range of motion CV: RRR, no m/r/g.   LUNGS: CTA bilat, nonlabored resps, good aeration in all lung fields. EXT: no clubbing, cyanosis, or edema.    LABS:  Lab Results  Component Value Date   TSH 0.71 04/27/2016  Lab Results  Component Value Date   WBC 10.7 (H) 04/27/2016   HGB 14.0 04/27/2016   HCT 41.8 04/27/2016   MCV 89.1 04/27/2016   PLT 253.0 04/27/2016   Lab Results  Component Value Date   CREATININE 0.63 04/27/2016   BUN 8 04/27/2016   NA 138 04/27/2016   K 4.3 04/27/2016   CL 103 04/27/2016   CO2 28 04/27/2016   Lab Results  Component Value Date   ALT 9 04/27/2016   AST 11 04/27/2016   ALKPHOS 58 04/27/2016   BILITOT 0.4 04/27/2016   Lab Results  Component Value Date   CHOL 193 10/02/2015   Lab Results  Component Value Date    HDL 50.10 10/02/2015   Lab Results  Component Value Date   LDLCALC 129 (H) 10/02/2015   Lab Results  Component Value Date   TRIG 71.0 10/02/2015   Lab Results  Component Value Date   CHOLHDL 4 10/02/2015   Lab Results  Component Value Date   HGBA1C 5.8 10/02/2015   IMPRESSION AND PLAN:  1) Uncontrolled HTN: increase lisinopril to 20mg  qd. Monitor bp once weekly at home and bring numbers to next o/v to review. BMET today.  2) Vag d/c: c/w bacterial vaginosis. Flagyl 500 mg bid x 7d.  3) Upper airway cough syndrome. Continue claritin qd. 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  Elevate the head of your bed (ideally with 6 inch  bed blocks),  Smoking cessation, avoidance of late meals, excessive alcohol, and avoid fatty foods, chocolate, peppermint, colas, red wine, and acidic juices such as orange juice.  NO MINT OR MENTHOL PRODUCTS SO NO COUGH DROPS   USE SUGARLESS CANDY INSTEAD (Jolley ranchers or Stover's or Life Savers) or even ice chips will also do - the key is to swallow to prevent all throat clearing. NO OIL BASED VITAMINS - use powdered substitutes.  4) Postsurgical hypothyroidism. TSH today.  An After Visit Summary was printed and given to the patient.  FOLLOW UP: Return in about 4 weeks (around 12/08/2017) for Welcome to medicare visit.  Signed:  Crissie Sickles, MD           11/10/2017

## 2017-11-11 LAB — BASIC METABOLIC PANEL
BUN: 10 mg/dL (ref 7–25)
CALCIUM: 9.7 mg/dL (ref 8.6–10.4)
CHLORIDE: 104 mmol/L (ref 98–110)
CO2: 23 mmol/L (ref 20–32)
Creat: 0.62 mg/dL (ref 0.50–0.99)
Glucose, Bld: 99 mg/dL (ref 65–99)
POTASSIUM: 4.7 mmol/L (ref 3.5–5.3)
SODIUM: 139 mmol/L (ref 135–146)

## 2017-11-11 LAB — EXTRA LAV TOP TUBE

## 2017-11-11 LAB — TSH: TSH: 0.36 mIU/L — ABNORMAL LOW (ref 0.40–4.50)

## 2017-11-15 ENCOUNTER — Other Ambulatory Visit: Payer: Self-pay

## 2017-11-15 MED ORDER — LEVOTHYROXINE SODIUM 112 MCG PO TABS: 112.0000 ug | ORAL_TABLET | Freq: Every day | ORAL | 3 refills | Status: DC

## 2017-11-15 NOTE — Telephone Encounter (Signed)
Refill on synthroid sent to pharmacy.

## 2017-12-05 DIAGNOSIS — N841 Polyp of cervix uteri: Secondary | ICD-10-CM

## 2017-12-05 DIAGNOSIS — Z124 Encounter for screening for malignant neoplasm of cervix: Secondary | ICD-10-CM

## 2017-12-05 HISTORY — DX: Polyp of cervix uteri: N84.1

## 2017-12-05 HISTORY — DX: Encounter for screening for malignant neoplasm of cervix: Z12.4

## 2017-12-14 ENCOUNTER — Ambulatory Visit: Payer: Medicare HMO | Admitting: Family Medicine

## 2017-12-14 ENCOUNTER — Encounter: Payer: Self-pay | Admitting: Family Medicine

## 2017-12-14 ENCOUNTER — Ambulatory Visit (INDEPENDENT_AMBULATORY_CARE_PROVIDER_SITE_OTHER): Payer: Medicare HMO | Admitting: Family Medicine

## 2017-12-14 VITALS — BP 164/83 | HR 90 | Temp 98.2°F | Resp 16 | Wt 185.0 lb

## 2017-12-14 DIAGNOSIS — J040 Acute laryngitis: Secondary | ICD-10-CM

## 2017-12-14 DIAGNOSIS — R05 Cough: Secondary | ICD-10-CM | POA: Diagnosis not present

## 2017-12-14 DIAGNOSIS — R059 Cough, unspecified: Secondary | ICD-10-CM

## 2017-12-14 DIAGNOSIS — J209 Acute bronchitis, unspecified: Secondary | ICD-10-CM | POA: Diagnosis not present

## 2017-12-14 MED ORDER — PREDNISONE 20 MG PO TABS
ORAL_TABLET | ORAL | 0 refills | Status: DC
Start: 2017-12-14 — End: 2017-12-15

## 2017-12-14 MED ORDER — ALBUTEROL SULFATE HFA 108 (90 BASE) MCG/ACT IN AERS: 2.0000 | INHALATION_SPRAY | Freq: Four times a day (QID) | RESPIRATORY_TRACT | 0 refills | Status: DC | PRN

## 2017-12-14 NOTE — Patient Instructions (Signed)
Get otc generic robitussin DM OR Mucinex DM and use as directed on the packaging for cough and congestion. Use otc generic saline nasal spray 2-3 times per day to irrigate/moisturize your nasal passages.   

## 2017-12-14 NOTE — Progress Notes (Signed)
OFFICE VISIT  12/14/2017   CC:  Chief Complaint  Patient presents with  . Cough    x 6 days   HPI:    Patient is a 67 y.o. Caucasian female who presents for 6-7 day history of cough and fatigue. Had left sided ST initially, felt some fatigue, then started coughing a lot that first night, lost her voice. No nasal congestion or runny nose.  Ribs hurt when she coughs.  +HA and body aches in first few days. Cough is productive lately, copious per pt report.  No wheezing or SOB.  First 2 nights she had temp about 100. Has had no n/v but had some loose stool last night.  Then today had normal stool.  Has been trying drink a lot of water lately. Tessalon perles no help. She got flu vaccine about 1 mo ago.  Now illness is just lots of coughing and fatigue--everything else has resolved.  Past Medical History:  Diagnosis Date  . Abnormal mammogram of right breast 10/2015   Benign-appearing cysts on f/u diagnostic mammo and ultrasound: recommended stay on annual mammogram screening schedule  . Allergy   . Essential hypertension 2017  . Hypothyroidism, postsurgical     Past Surgical History:  Procedure Laterality Date  . COLONOSCOPY  06/2007   Normal Sharlett Iles)  . DEXA  08/2006   T -0.3  . ENDOMETRIAL BIOPSY  12/1999   Benign  . THYROIDECTOMY  07/02/15   Dr. Kelli Churn  . TUBAL LIGATION  03/03/89    Outpatient Medications Prior to Visit  Medication Sig Dispense Refill  . calcium-vitamin D (OSCAL WITH D) 250-125 MG-UNIT per tablet Take 1 tablet by mouth daily.    . hydrocortisone valerate ointment (WEST-CORT) 0.2 % Apply with a Q tip 1-2 times per day as needed for dermatitis in ear canal 15 g 2  . levothyroxine (SYNTHROID, LEVOTHROID) 112 MCG tablet Take 1 tablet (112 mcg total) by mouth daily before breakfast. 90 tablet 3  . lisinopril (PRINIVIL,ZESTRIL) 20 MG tablet Take 1 tablet (20 mg total) by mouth daily. 30 tablet 1  . loratadine (CLARITIN) 10 MG tablet Take 10 mg by  mouth.    . Multiple Vitamins-Minerals (MULTIVITAMIN PO) Take by mouth.    . pantoprazole (PROTONIX) 40 MG tablet Take 1 tablet (40 mg total) by mouth daily. 30 tablet 3  . metroNIDAZOLE (FLAGYL) 500 MG tablet 1 tab po bid x 7d 14 tablet 0   No facility-administered medications prior to visit.     No Known Allergies  ROS As per HPI  PE: Blood pressure (!) 164/83, pulse 90, temperature 98.2 F (36.8 C), temperature source Oral, resp. rate 16, weight 185 lb (83.9 kg), SpO2 98 %. VS: noted--normal. Gen: alert, NAD, NONTOXIC APPEARING. HEENT: eyes without injection, drainage, or swelling.  Ears: EACs clear, TMs with normal light reflex and landmarks.  Nose: Clear rhinorrhea, with some dried, crusty exudate adherent to mildly injected mucosa.  No purulent d/c.  No paranasal sinus TTP.  No facial swelling.  Throat and mouth without focal lesion.  No pharyngial swelling, erythema, or exudate.   Neck: supple, no LAD.   LUNGS: CTA bilat, nonlabored resps.  Occ insp rhonchi in bases that clear with forceful cough.  No significant post-exhalation coughing.  No wheezing or crackles.  Exp phase not prolonged. CV: RRR, no m/r/g. EXT: no c/c/e SKIN: no rash    LABS:    Chemistry      Component Value Date/Time   NA  139 11/10/2017 0947   K 4.7 11/10/2017 0947   CL 104 11/10/2017 0947   CO2 23 11/10/2017 0947   BUN 10 11/10/2017 0947   CREATININE 0.62 11/10/2017 0947      Component Value Date/Time   CALCIUM 9.7 11/10/2017 0947   ALKPHOS 58 04/27/2016 1558   AST 11 04/27/2016 1558   ALT 9 04/27/2016 1558   BILITOT 0.4 04/27/2016 1558     Lab Results  Component Value Date   TSH 0.36 (L) 11/10/2017     IMPRESSION AND PLAN:  Acute bronchitis and laryngitis. Will check CXR to r/o infiltrate. Prednisone 40mg  qd x 5d, then 20mg  qd x 5d. Albuterol HFA 2p q6h prn.  Inhaler education done by nurse today. Get otc generic robitussin DM OR Mucinex DM and use as directed on the packaging  for cough and congestion. Use otc generic saline nasal spray 2-3 times per day to irrigate/moisturize your nasal passages.  An After Visit Summary was printed and given to the patient.  FOLLOW UP: Return if symptoms worsen or fail to improve in 5-7d.  Signed:  Crissie Sickles, MD           12/14/2017

## 2017-12-15 ENCOUNTER — Telehealth: Payer: Self-pay | Admitting: *Deleted

## 2017-12-15 ENCOUNTER — Ambulatory Visit: Payer: Medicare HMO | Admitting: Family Medicine

## 2017-12-15 ENCOUNTER — Ambulatory Visit (HOSPITAL_BASED_OUTPATIENT_CLINIC_OR_DEPARTMENT_OTHER)
Admission: RE | Admit: 2017-12-15 | Discharge: 2017-12-15 | Disposition: A | Discharge status: Home / Self Care | Payer: Medicare HMO | Source: Ambulatory Visit | Attending: Family Medicine | Admitting: Family Medicine

## 2017-12-15 DIAGNOSIS — R059 Cough, unspecified: Secondary | ICD-10-CM

## 2017-12-15 DIAGNOSIS — J209 Acute bronchitis, unspecified: Secondary | ICD-10-CM | POA: Diagnosis not present

## 2017-12-15 DIAGNOSIS — R05 Cough: Secondary | ICD-10-CM | POA: Diagnosis not present

## 2017-12-15 MED ORDER — PREDNISONE 20 MG PO TABS: ORAL_TABLET | ORAL | 0 refills | Status: DC

## 2017-12-15 MED ORDER — ALBUTEROL SULFATE HFA 108 (90 BASE) MCG/ACT IN AERS: 2.0000 | INHALATION_SPRAY | Freq: Four times a day (QID) | RESPIRATORY_TRACT | 0 refills | Status: DC | PRN

## 2017-12-15 NOTE — Telephone Encounter (Signed)
Copied from [place] [id number]. Topic: General - Other >> Dec 15, 2017 10:02 AM Clack, Laban Emperor wrote: Reason for CRM: Pt medication was called into the wrong pharmacy. Please correct and send the 2 medications that was called in on 12/14/17 to: [facility] [address] [place] [address] [phone] (Phone) [phone] (Fax)

## 2017-12-15 NOTE — Telephone Encounter (Signed)
Rx's sent to [facility] have been cancelled. I sent new Rx's for prednisone and albuterol to [facility] per pts request.

## 2017-12-15 NOTE — Addendum Note (Signed)
Addended by: Onalee Hua on: 12/15/2017 10:42 AM   Modules accepted: Orders

## 2017-12-20 ENCOUNTER — Encounter: Payer: Self-pay | Admitting: *Deleted

## 2017-12-20 DIAGNOSIS — J3089 Other allergic rhinitis: Secondary | ICD-10-CM | POA: Insufficient documentation

## 2017-12-21 ENCOUNTER — Other Ambulatory Visit: Payer: Self-pay | Admitting: Family Medicine

## 2017-12-21 ENCOUNTER — Ambulatory Visit (INDEPENDENT_AMBULATORY_CARE_PROVIDER_SITE_OTHER): Payer: Medicare HMO | Admitting: Family Medicine

## 2017-12-21 ENCOUNTER — Other Ambulatory Visit: Payer: Self-pay | Admitting: *Deleted

## 2017-12-21 ENCOUNTER — Encounter: Payer: Self-pay | Admitting: Family Medicine

## 2017-12-21 VITALS — BP 183/93 | HR 91 | Temp 97.7°F | Wt 192.4 lb

## 2017-12-21 DIAGNOSIS — Z136 Encounter for screening for cardiovascular disorders: Secondary | ICD-10-CM

## 2017-12-21 DIAGNOSIS — I10 Essential (primary) hypertension: Secondary | ICD-10-CM | POA: Diagnosis not present

## 2017-12-21 DIAGNOSIS — Z114 Encounter for screening for human immunodeficiency virus [HIV]: Secondary | ICD-10-CM

## 2017-12-21 DIAGNOSIS — Z0000 Encounter for general adult medical examination without abnormal findings: Secondary | ICD-10-CM

## 2017-12-21 DIAGNOSIS — Z9189 Other specified personal risk factors, not elsewhere classified: Secondary | ICD-10-CM

## 2017-12-21 DIAGNOSIS — Z1159 Encounter for screening for other viral diseases: Secondary | ICD-10-CM

## 2017-12-21 DIAGNOSIS — Z13820 Encounter for screening for osteoporosis: Secondary | ICD-10-CM

## 2017-12-21 DIAGNOSIS — Z1231 Encounter for screening mammogram for malignant neoplasm of breast: Secondary | ICD-10-CM

## 2017-12-21 DIAGNOSIS — M810 Age-related osteoporosis without current pathological fracture: Secondary | ICD-10-CM

## 2017-12-21 NOTE — Progress Notes (Signed)
WELCOME TO MEDICARE (IPPE) VISIT I explained that today's visit was for the purpose of health promotion and disease detection, as well as an introduction to Medicare and it's covered benefits.  I explained that no labs or other services would be performed today, but if any were determined to be necessary then appropriate orders/referrals would be arranged for these to be done at a future date.  Patient is a 67 y/o white female who is already an established patient with me.  Pt's medical and social history were reviewed. Specifically, we reviewed PMH/PSH/Meds/FH.  Also reviewed alcohol, tobacco, and illicit drug use.  Diet and physical activity reviewed. Exercise: none currently, plans to start silver sneakers soon. Diet: not working on anything in particular with diet lately. All of this info is also found in the appropriate sections of pt's EMR.  Pt was screened with appropriate screening instrument for depression.  Current or past experiences with mood disorders was discussed.    Depression screen PHQ 2/9 12/21/2017  Decreased Interest 1  Down, Depressed, Hopeless 0  PHQ - 2 Score 1    Fall Risk  12/21/2017 11/10/2017  Falls in the past year? No No    Pt's functional ability and level of safety were reviewed. Specifically, I screened for hearing impairment and fall risk.  I assessed home safety and we discussed pt's competency with activities of daily living.  No issues.  EXAM: Vitals:   12/21/17 0933  BP: (!) 183/93  Pulse: 91  Temp: 97.7 F (36.5 C)  SpO2: 97%   Body mass index is 37.58 kg/m. Visual acuity screen: No exam data present {Pt gets routine eye care through optometrist/ophthalmologist and [facility] in [place].  Exam last year. No additional physical exam required or indicated today.  End of life planning: Advanced directives and power of attorney information specific to the patient were discussed.  Will give pt packet for this today.  Education,  counseling, and referrals based on the information obtained/reviewed today: Advanced directives.  BP (!) 183/93 (BP Location: Left Arm, Patient Position: Sitting, Cuff Size: Large)   Pulse 91   Temp 97.7 F (36.5 C) (Oral)   Wt 192 lb 6.4 oz (87.3 kg)   SpO2 97%   BMI 37.58 kg/m   Lab Results  Component Value Date   TSH 0.36 (L) 11/10/2017   Lab Results  Component Value Date   WBC 10.7 (H) 04/27/2016   HGB 14.0 04/27/2016   HCT 41.8 04/27/2016   MCV 89.1 04/27/2016   PLT 253.0 04/27/2016   Lab Results  Component Value Date   CREATININE 0.62 11/10/2017   BUN 10 11/10/2017   NA 139 11/10/2017   K 4.7 11/10/2017   CL 104 11/10/2017   CO2 23 11/10/2017   Lab Results  Component Value Date   ALT 9 04/27/2016   AST 11 04/27/2016   ALKPHOS 58 04/27/2016   BILITOT 0.4 04/27/2016   Lab Results  Component Value Date   CHOL 193 10/02/2015   Lab Results  Component Value Date   HDL 50.10 10/02/2015   Lab Results  Component Value Date   LDLCALC 129 (H) 10/02/2015   Lab Results  Component Value Date   TRIG 71.0 10/02/2015   Lab Results  Component Value Date   CHOLHDL 4 10/02/2015   Lab Results  Component Value Date   HGBA1C 5.8 10/02/2015    Education, counseling, and referral for other preventive services: Written checklist was completed and given to  pt for obtaining, as appropriate, the other preventive services that are covered as separate Medicare Part B benefits. Possible services that were reviewed with pt are:  -annual wellness visit (AWV)--discussed. -Bone mass measurements--she is due, will defer until mammo and pap are done. -Cardiovascular screening blood tests: FLP and BMET ordered --future. -Colorectal cancer screening- iFOB to be given at her pap/pelvic. -Counseling to prevent tobacco use: n/a -Diabetes screening tests: checking glucose fasting--future. -Diabetes self-management training (DSMT): n/a -Glaucoma screening: through eye MD -HIV  screening: ordered future. -Hep C screening: ordered future. -Medical nutrition therapy: n/a -Prostate cancer screening: n/a -Seasonal influenza, pneumococcal, and Hep B vaccines: prevnar to be done at upcoming pap/pelvic visit here.  Pneumovax 23 1 yr later. -screening mammography: pt to schedule -screening pap tests and pelvic exam: pt to return to see me to get this done. -ultrasound screening for AAA: pt doesn't qualify.  Of the above listed services, no referrals were made today but she will make appt to return for pap/pelvic exam and get the above things done at that time.  Patient did have an additional complaint/problem that was discussed and evaluated today. HTN: hx of elevated bp's in our office so we have had her checking home bp's: avg at home is 130/80, HR avg 80.  We decided to continue lisinopril 20mg  qd today and she'll continue to monitor bp 2-3 times per week.  Patient was given opportunity to ask any additional questions regarding Medicare and covered benefits.  Patient was informed that Medicare does not provide coverage for routine physical exams.  I answered all questions to the best of my ability today.    An After Visit Summary was printed and given to the patient.  Follow up: Return for make an appointment (30 min) for GYN exam--pap/pelvic here at your earliest convenience.  Signed:  Crissie Sickles, MD           12/21/2017

## 2018-01-03 ENCOUNTER — Encounter: Payer: Self-pay | Admitting: Family Medicine

## 2018-01-03 ENCOUNTER — Ambulatory Visit (INDEPENDENT_AMBULATORY_CARE_PROVIDER_SITE_OTHER): Payer: Medicare HMO | Admitting: Family Medicine

## 2018-01-03 ENCOUNTER — Other Ambulatory Visit (HOSPITAL_COMMUNITY)
Admission: RE | Admit: 2018-01-03 | Discharge: 2018-01-03 | Disposition: A | Discharge status: Home / Self Care | Payer: Medicare HMO | Source: Ambulatory Visit | Attending: Family Medicine | Admitting: Family Medicine

## 2018-01-03 VITALS — BP 155/77 | HR 83 | Temp 98.2°F | Resp 16 | Ht 60.0 in | Wt 186.8 lb

## 2018-01-03 DIAGNOSIS — Z114 Encounter for screening for human immunodeficiency virus [HIV]: Secondary | ICD-10-CM | POA: Diagnosis not present

## 2018-01-03 DIAGNOSIS — Z1159 Encounter for screening for other viral diseases: Secondary | ICD-10-CM

## 2018-01-03 DIAGNOSIS — N841 Polyp of cervix uteri: Secondary | ICD-10-CM | POA: Diagnosis not present

## 2018-01-03 DIAGNOSIS — Z1211 Encounter for screening for malignant neoplasm of colon: Secondary | ICD-10-CM | POA: Diagnosis not present

## 2018-01-03 DIAGNOSIS — Z136 Encounter for screening for cardiovascular disorders: Secondary | ICD-10-CM | POA: Diagnosis not present

## 2018-01-03 DIAGNOSIS — Z9189 Other specified personal risk factors, not elsewhere classified: Secondary | ICD-10-CM

## 2018-01-03 DIAGNOSIS — R69 Illness, unspecified: Secondary | ICD-10-CM | POA: Diagnosis not present

## 2018-01-03 DIAGNOSIS — Z0000 Encounter for general adult medical examination without abnormal findings: Secondary | ICD-10-CM | POA: Diagnosis not present

## 2018-01-03 DIAGNOSIS — Z23 Encounter for immunization: Secondary | ICD-10-CM | POA: Diagnosis not present

## 2018-01-03 DIAGNOSIS — Z124 Encounter for screening for malignant neoplasm of cervix: Secondary | ICD-10-CM

## 2018-01-03 DIAGNOSIS — I10 Essential (primary) hypertension: Secondary | ICD-10-CM

## 2018-01-03 DIAGNOSIS — Z01411 Encounter for gynecological examination (general) (routine) with abnormal findings: Secondary | ICD-10-CM

## 2018-01-03 MED ORDER — LEVOTHYROXINE SODIUM 112 MCG PO TABS: 112.0000 ug | ORAL_TABLET | Freq: Every day | ORAL | 3 refills | Status: DC

## 2018-01-03 MED ORDER — LISINOPRIL 20 MG PO TABS: 20.0000 mg | ORAL_TABLET | Freq: Every day | ORAL | 3 refills | Status: DC

## 2018-01-03 NOTE — Patient Instructions (Signed)

## 2018-01-03 NOTE — Progress Notes (Signed)
OFFICE VISIT  01/03/2018   CC:  Chief Complaint  Patient presents with  . Gynecologic Exam   HPI:    Patient is a 67 y.o. Caucasian female who presents for GYN exam/cervical cancer screening--pap smear. She denies hx of abnl pap smear. No FH of cervical Ca. Most recent pap was 07/2014.    Past Medical History:  Diagnosis Date  . Abnormal mammogram of right breast 10/2015   Benign-appearing cysts on f/u diagnostic mammo and ultrasound: recommended stay on annual mammogram screening schedule  . Allergy   . Essential hypertension 2017  . Hypothyroidism, postsurgical     Past Surgical History:  Procedure Laterality Date  . COLONOSCOPY  06/2007   Normal Sharlett Iles)  . DEXA  08/2006   T -0.3  . ENDOMETRIAL BIOPSY  12/1999   Benign  . THYROIDECTOMY  07/02/15   Dr. Kelli Churn  . TUBAL LIGATION  03/03/89   Social History   Socioeconomic History  . Marital status: Married    Spouse name: None  . Number of children: None  . Years of education: None  . Highest education level: None  Social Needs  . Financial resource strain: None  . Food insecurity - worry: None  . Food insecurity - inability: None  . Transportation needs - medical: None  . Transportation needs - non-medical: None  Occupational History  . None  Tobacco Use  . Smoking status: Never Smoker  . Smokeless tobacco: Never Used  Substance and Sexual Activity  . Alcohol use: No  . Drug use: No  . Sexual activity: None  Other Topics Concern  . None  Social History Narrative   Married, one biologic daughter and one stepson.   Education: 12 grade.   Occupation: Woodworker Tax inspector).   No T/A/Ds.         Family History  Problem Relation Age of Onset  . Arthritis Mother   . Breast cancer Mother 49  . Hypertension Mother   . Diabetes Mother   . Heart attack Father   . Diabetes Father   . Alcohol abuse Father   . Thyroid disease Neg Hx      Outpatient Medications Prior to Visit   Medication Sig Dispense Refill  . albuterol (VENTOLIN HFA) 108 (90 Base) MCG/ACT inhaler Inhale 2 puffs into the lungs every 6 (six) hours as needed for wheezing or shortness of breath. 1 Inhaler 0  . calcium-vitamin D (OSCAL WITH D) 250-125 MG-UNIT per tablet Take 1 tablet by mouth daily.    Marland Kitchen loratadine (CLARITIN) 10 MG tablet Take 10 mg by mouth.    . Multiple Vitamins-Minerals (MULTIVITAMIN PO) Take by mouth.    . pantoprazole (PROTONIX) 40 MG tablet Take 1 tablet (40 mg total) by mouth daily. 30 tablet 3  . levothyroxine (SYNTHROID, LEVOTHROID) 112 MCG tablet Take 1 tablet (112 mcg total) by mouth daily before breakfast. 90 tablet 3  . lisinopril (PRINIVIL,ZESTRIL) 20 MG tablet Take 1 tablet (20 mg total) by mouth daily. 30 tablet 1  . hydrocortisone valerate ointment (WEST-CORT) 0.2 % Apply with a Q tip 1-2 times per day as needed for dermatitis in ear canal (Patient not taking: Reported on 12/21/2017) 15 g 2  . predniSONE (DELTASONE) 20 MG tablet 2 tabs po qd x 5d, then 1 tab po qd x 5d (Patient not taking: Reported on 01/03/2018) 15 tablet 0   No facility-administered medications prior to visit.     No Known Allergies  ROS As per HPI  PE: Blood pressure (!) 155/77, pulse 83, temperature 98.2 F (36.8 C), temperature source Oral, resp. rate 16, height 5' (1.524 m), weight 186 lb 12 oz (84.7 kg), SpO2 98 %. Body mass index is 36.47 kg/m.  Pt examined with Helayne Seminole, CMA, as chaperone. Gen: Alert, well appearing.  Patient is oriented to person, place, time, and situation. AFFECT: pleasant, lucid thought and speech. ENT: Ears: EACs clear, normal epithelium.  TMs with good light reflex and landmarks bilaterally.  Eyes: no injection, icteris, swelling, or exudate.  EOMI, PERRLA. Nose: no drainage or turbinate edema/swelling.  No injection or focal lesion.  Mouth: lips without lesion/swelling.  Oral mucosa pink and moist.  Dentition intact and without obvious caries or gingival  swelling.  Oropharynx without erythema, exudate, or swelling.  Neck: supple/nontender.  No LAD, mass, or TM.  Carotid pulses 2+ bilaterally, without bruits. CV: RRR, no m/r/g.   LUNGS: CTA bilat, nonlabored resps, good aeration in all lung fields. ABD: soft, NT, ND, BS normal.  No hepatospenomegaly or mass.  No bruits. EXT: no clubbing, cyanosis, or edema.  Musculoskeletal: no joint swelling, erythema, warmth, or tenderness.  ROM of all joints intact. Skin - no sores or suspicious lesions or rashes or color changes Vagina and vulva are normal;  no discharge is noted.  Cervix normal other than an erythematous, smoothly round 1cm polypoid lesion at 5 o'clock position.  No bleeding.  Uterus anteverted and mobile, normal in size and shape without tenderness.  Adnexa normal in size without masses or tenderness. Pap Smear - is completed today. Exam chaperoned by female assistant.   LABS:  Lab Results  Component Value Date   TSH 0.36 (L) 11/10/2017   Lab Results  Component Value Date   WBC 10.7 (H) 04/27/2016   HGB 14.0 04/27/2016   HCT 41.8 04/27/2016   MCV 89.1 04/27/2016   PLT 253.0 04/27/2016   Lab Results  Component Value Date   CREATININE 0.62 11/10/2017   BUN 10 11/10/2017   NA 139 11/10/2017   K 4.7 11/10/2017   CL 104 11/10/2017   CO2 23 11/10/2017   Lab Results  Component Value Date   ALT 9 04/27/2016   AST 11 04/27/2016   ALKPHOS 58 04/27/2016   BILITOT 0.4 04/27/2016   Lab Results  Component Value Date   CHOL 193 10/02/2015   Lab Results  Component Value Date   HDL 50.10 10/02/2015   Lab Results  Component Value Date   LDLCALC 129 (H) 10/02/2015   Lab Results  Component Value Date   TRIG 71.0 10/02/2015   Lab Results  Component Value Date   CHOLHDL 4 10/02/2015   Lab Results  Component Value Date   HGBA1C 5.8 10/02/2015   IMPRESSION AND PLAN:  1) Cervical polyp: this certainly appears benign but I need to refer her to GYN to have them evaluate it  and see if further w/u and/or resection of this lesion is needed.  Referral ordered today.  2) Health maintenance exam: Reviewed age and gender appropriate health maintenance issues (prudent diet, regular exercise, health risks of tobacco and excessive alcohol, use of seatbelts, fire alarms in home, use of sunscreen).  Also reviewed age and gender appropriate health screening as well as vaccine recommendations. Cervical ca screening: pap smear done, HR HPV for any interpretation ordered. Breast ca screening: mammogram scheduled for 01/2018. Labs: CMET, FLP (HTN, screening for CV dz), Hep C and HIV screening. Colon ca screening: due for repeat colonoscopy  2018.  However, she chooses iFOB today (she is avg risk). Vaccines: Prevnar 13 today. Dexa: has been 3 yrs--repeat scheduled.  An After Visit Summary was printed and given to the patient.  FOLLOW UP: Return in about 6 months (around 07/03/2018) for routine chronic illness f/u.  Signed:  Crissie Sickles, MD           01/03/2018

## 2018-01-05 ENCOUNTER — Other Ambulatory Visit: Payer: Self-pay | Admitting: Family Medicine

## 2018-01-05 DIAGNOSIS — E7800 Pure hypercholesterolemia, unspecified: Secondary | ICD-10-CM

## 2018-01-05 DIAGNOSIS — Z136 Encounter for screening for cardiovascular disorders: Secondary | ICD-10-CM

## 2018-01-05 DIAGNOSIS — I10 Essential (primary) hypertension: Secondary | ICD-10-CM

## 2018-01-05 HISTORY — PX: CERVICAL POLYPECTOMY: SHX88

## 2018-01-05 HISTORY — DX: Pure hypercholesterolemia, unspecified: E78.00

## 2018-01-05 LAB — CYTOLOGY - PAP
Diagnosis: NEGATIVE
HPV: NOT DETECTED

## 2018-01-05 LAB — EXTRA LAV TOP TUBE

## 2018-01-05 LAB — HEPATITIS C ANTIBODY
Hepatitis C Ab: NONREACTIVE
SIGNAL TO CUT-OFF: 0.02 (ref <1.00)

## 2018-01-05 LAB — LIPID PANEL

## 2018-01-05 LAB — HIV ANTIBODY (ROUTINE TESTING W REFLEX): HIV 1&2 Ab, 4th Generation: NONREACTIVE

## 2018-01-05 LAB — COMPREHENSIVE METABOLIC PANEL

## 2018-01-08 ENCOUNTER — Other Ambulatory Visit: Payer: Medicare HMO

## 2018-01-08 DIAGNOSIS — I10 Essential (primary) hypertension: Secondary | ICD-10-CM | POA: Diagnosis not present

## 2018-01-08 DIAGNOSIS — Z136 Encounter for screening for cardiovascular disorders: Secondary | ICD-10-CM

## 2018-01-08 NOTE — Progress Notes (Signed)
Patient presents for CMET and Lipid panel.  [facility] stated they did not have enough blood to test these at her last OV.   No charges are being dropped for phlebotomy collection.

## 2018-01-09 ENCOUNTER — Other Ambulatory Visit: Payer: Self-pay

## 2018-01-09 ENCOUNTER — Encounter: Payer: Self-pay | Admitting: Family Medicine

## 2018-01-09 DIAGNOSIS — E7800 Pure hypercholesterolemia, unspecified: Secondary | ICD-10-CM

## 2018-01-09 LAB — LIPID PANEL
CHOLESTEROL: 228 mg/dL — AB (ref <200)
HDL: 40 mg/dL — AB (ref >50)
LDL CHOLESTEROL (CALC): 161 mg/dL — AB
Non-HDL Cholesterol (Calc): 188 mg/dL (calc) — ABNORMAL HIGH (ref <130)
TRIGLYCERIDES: 147 mg/dL (ref <150)
Total CHOL/HDL Ratio: 5.7 (calc) — ABNORMAL HIGH (ref <5.0)

## 2018-01-09 LAB — COMPREHENSIVE METABOLIC PANEL
AG Ratio: 1.4 (calc) (ref 1.0–2.5)
ALBUMIN MSPROF: 4 g/dL (ref 3.6–5.1)
ALKALINE PHOSPHATASE (APISO): 54 U/L (ref 33–130)
ALT: 12 U/L (ref 6–29)
AST: 11 U/L (ref 10–35)
BUN: 10 mg/dL (ref 7–25)
CO2: 24 mmol/L (ref 20–32)
CREATININE: 0.67 mg/dL (ref 0.50–0.99)
Calcium: 9.5 mg/dL (ref 8.6–10.4)
Chloride: 103 mmol/L (ref 98–110)
Globulin: 2.8 g/dL (calc) (ref 1.9–3.7)
Glucose, Bld: 97 mg/dL (ref 65–99)
POTASSIUM: 4.5 mmol/L (ref 3.5–5.3)
Sodium: 140 mmol/L (ref 135–146)
Total Bilirubin: 0.3 mg/dL (ref 0.2–1.2)
Total Protein: 6.8 g/dL (ref 6.1–8.1)

## 2018-01-09 MED ORDER — ATORVASTATIN CALCIUM 20 MG PO TABS: 20.0000 mg | ORAL_TABLET | Freq: Every day | ORAL | 2 refills | Status: DC

## 2018-01-10 ENCOUNTER — Other Ambulatory Visit (INDEPENDENT_AMBULATORY_CARE_PROVIDER_SITE_OTHER): Payer: Medicare HMO

## 2018-01-10 DIAGNOSIS — Z1211 Encounter for screening for malignant neoplasm of colon: Secondary | ICD-10-CM

## 2018-01-10 HISTORY — DX: Encounter for screening for malignant neoplasm of colon: Z12.11

## 2018-01-10 LAB — FECAL OCCULT BLOOD, IMMUNOCHEMICAL: Fecal Occult Bld: NEGATIVE

## 2018-01-11 DIAGNOSIS — N841 Polyp of cervix uteri: Secondary | ICD-10-CM | POA: Diagnosis not present

## 2018-01-15 ENCOUNTER — Ambulatory Visit
Admission: RE | Admit: 2018-01-15 | Discharge: 2018-01-15 | Disposition: A | Discharge status: Home / Self Care | Payer: Medicare HMO | Source: Ambulatory Visit | Attending: Family Medicine | Admitting: Family Medicine

## 2018-01-15 ENCOUNTER — Encounter: Payer: Self-pay | Admitting: Family Medicine

## 2018-01-15 DIAGNOSIS — Z1231 Encounter for screening mammogram for malignant neoplasm of breast: Secondary | ICD-10-CM | POA: Diagnosis not present

## 2018-01-15 DIAGNOSIS — M810 Age-related osteoporosis without current pathological fracture: Secondary | ICD-10-CM

## 2018-01-15 DIAGNOSIS — Z780 Asymptomatic menopausal state: Secondary | ICD-10-CM | POA: Diagnosis not present

## 2018-01-15 DIAGNOSIS — Z13820 Encounter for screening for osteoporosis: Secondary | ICD-10-CM | POA: Diagnosis not present

## 2018-01-16 ENCOUNTER — Encounter: Payer: Self-pay | Admitting: Family Medicine

## 2018-01-17 ENCOUNTER — Telehealth: Payer: Self-pay | Admitting: Family Medicine

## 2018-01-17 MED ORDER — PANTOPRAZOLE SODIUM 40 MG PO TBEC: 40.0000 mg | DELAYED_RELEASE_TABLET | Freq: Every day | ORAL | 1 refills | Status: DC

## 2018-01-17 NOTE — Telephone Encounter (Signed)
Copied from [place]. Topic: Quick Communication - Rx Refill/Question >> Jan 17, 2018  1:41 PM Scherrie Gerlach wrote: Medication: pantoprazole (PROTONIX) 40 MG tablet 90 day  Pt states she is switching to mail order and needs a new Rx sent to [facility], [place] [phone] (Phone) [phone] (Fax)  They advised pt to call for first time Rx

## 2018-01-17 NOTE — Telephone Encounter (Signed)
Protonix 40 mg refill Last OV: 12/21/17 Last Refill:11/20/17 30 tab/3 refills-requesting 90 day due to switching to mail order [facility] [phone]

## 2018-01-17 NOTE — Telephone Encounter (Signed)
Rx sent to [facility] delivery pharmacy.

## 2018-03-26 ENCOUNTER — Other Ambulatory Visit: Payer: Self-pay | Admitting: *Deleted

## 2018-03-26 MED ORDER — ATORVASTATIN CALCIUM 20 MG PO TABS: 20.0000 mg | ORAL_TABLET | Freq: Every day | ORAL | 0 refills | Status: DC

## 2018-03-29 DIAGNOSIS — Z6836 Body mass index (BMI) 36.0-36.9, adult: Secondary | ICD-10-CM | POA: Diagnosis not present

## 2018-03-29 DIAGNOSIS — E039 Hypothyroidism, unspecified: Secondary | ICD-10-CM | POA: Diagnosis not present

## 2018-03-29 DIAGNOSIS — R32 Unspecified urinary incontinence: Secondary | ICD-10-CM | POA: Diagnosis not present

## 2018-03-29 DIAGNOSIS — K08409 Partial loss of teeth, unspecified cause, unspecified class: Secondary | ICD-10-CM | POA: Diagnosis not present

## 2018-03-29 DIAGNOSIS — K219 Gastro-esophageal reflux disease without esophagitis: Secondary | ICD-10-CM | POA: Diagnosis not present

## 2018-03-29 DIAGNOSIS — I10 Essential (primary) hypertension: Secondary | ICD-10-CM | POA: Diagnosis not present

## 2018-03-29 DIAGNOSIS — J309 Allergic rhinitis, unspecified: Secondary | ICD-10-CM | POA: Diagnosis not present

## 2018-03-29 DIAGNOSIS — Z7722 Contact with and (suspected) exposure to environmental tobacco smoke (acute) (chronic): Secondary | ICD-10-CM | POA: Diagnosis not present

## 2018-03-29 DIAGNOSIS — E785 Hyperlipidemia, unspecified: Secondary | ICD-10-CM | POA: Diagnosis not present

## 2018-04-04 ENCOUNTER — Other Ambulatory Visit (INDEPENDENT_AMBULATORY_CARE_PROVIDER_SITE_OTHER): Payer: Medicare HMO

## 2018-04-04 DIAGNOSIS — E7800 Pure hypercholesterolemia, unspecified: Secondary | ICD-10-CM

## 2018-04-04 LAB — LIPID PANEL
Cholesterol: 137 mg/dL (ref 0–200)
HDL: 48.6 mg/dL (ref >39.00)
LDL Cholesterol: 70 mg/dL (ref 0–99)
NONHDL: 88.64
TRIGLYCERIDES: 92 mg/dL (ref 0.0–149.0)
Total CHOL/HDL Ratio: 3
VLDL: 18.4 mg/dL (ref 0.0–40.0)

## 2018-04-05 ENCOUNTER — Encounter: Payer: Self-pay | Admitting: Family Medicine

## 2018-05-11 DIAGNOSIS — H5200 Hypermetropia, unspecified eye: Secondary | ICD-10-CM | POA: Diagnosis not present

## 2018-05-11 DIAGNOSIS — I10 Essential (primary) hypertension: Secondary | ICD-10-CM | POA: Diagnosis not present

## 2018-05-14 DIAGNOSIS — Z0100 Encounter for examination of eyes and vision without abnormal findings: Secondary | ICD-10-CM | POA: Diagnosis not present

## 2018-07-03 ENCOUNTER — Encounter: Payer: Self-pay | Admitting: Family Medicine

## 2018-07-03 ENCOUNTER — Ambulatory Visit (INDEPENDENT_AMBULATORY_CARE_PROVIDER_SITE_OTHER): Payer: Medicare HMO | Admitting: Family Medicine

## 2018-07-03 VITALS — BP 176/70 | HR 73 | Temp 98.4°F | Resp 16 | Ht 60.0 in | Wt 182.4 lb

## 2018-07-03 DIAGNOSIS — I10 Essential (primary) hypertension: Secondary | ICD-10-CM | POA: Diagnosis not present

## 2018-07-03 DIAGNOSIS — B9689 Other specified bacterial agents as the cause of diseases classified elsewhere: Secondary | ICD-10-CM | POA: Diagnosis not present

## 2018-07-03 DIAGNOSIS — E7800 Pure hypercholesterolemia, unspecified: Secondary | ICD-10-CM

## 2018-07-03 DIAGNOSIS — E890 Postprocedural hypothyroidism: Secondary | ICD-10-CM

## 2018-07-03 DIAGNOSIS — L309 Dermatitis, unspecified: Secondary | ICD-10-CM

## 2018-07-03 DIAGNOSIS — N760 Acute vaginitis: Secondary | ICD-10-CM | POA: Diagnosis not present

## 2018-07-03 DIAGNOSIS — E669 Obesity, unspecified: Secondary | ICD-10-CM | POA: Diagnosis not present

## 2018-07-03 MED ORDER — NEOMYCIN-POLYMYXIN-HC 3.5-10000-1 OT SOLN: OTIC | 0 refills | Status: DC

## 2018-07-03 MED ORDER — METRONIDAZOLE 500 MG PO TABS: 500.0000 mg | ORAL_TABLET | Freq: Two times a day (BID) | ORAL | 0 refills | Status: AC

## 2018-07-03 NOTE — Progress Notes (Signed)
OFFICE VISIT  07/03/2018   CC:  Chief Complaint  Patient presents with  . Follow-up    RCI, pt is fasting.      HPI:    Patient is a 67 y.o. Caucasian female who presents for 6 mo f/u HTN, HLD, and postsurgical hypothyroidism. "Feeling alright except my nerves are shot"  Some identity theft problems recently.  HTN: bp up and down lately, as high as 159.  Usually 629 systolics, diastolics 52W.  She doesn't pay attention to her HRs. HLD: tolerating atorva w/out problem.  Hypoth: takes on empty stomach in AM with water only, waits at least 30 min before taking anything else or eating.  Not taking biotin.  Allergic rhinitis worse lately.  Ears itching a lot.  Describes malodorous, yellowish vaginal d/c.  Monostat helps a little. Mild itching.  Amount of d/c "more than usual".    Past Medical History:  Diagnosis Date  . Abnormal mammogram of right breast 10/2015   Benign-appearing cysts on f/u diagnostic mammo and ultrasound: recommended stay on annual mammogram screening schedule  . Allergy   . Cervical cancer screening 12/2017   Pap normal-->no further pap screening indicated.  . Cervical polyp 12/2017   resected 01/2018 by Dr. Ailene Rud  . Colon cancer screening 01/10/2018   iFOB neg.  Repeat 01/2019  . Essential hypertension 2017  . Hypercholesterolemia 01/2018   Excellent response to 20mg  atorva 2019  . Hypothyroidism, postsurgical     Past Surgical History:  Procedure Laterality Date  . CERVICAL POLYPECTOMY  01/2018  . COLONOSCOPY  06/2007   Normal Sharlett Iles)  . DEXA  08/2006; 01/2018   2007; T -0.3.  2019 T score -0.5.  Marland Kitchen ENDOMETRIAL BIOPSY  12/1999   Benign  . THYROIDECTOMY  07/02/15   Dr. Kelli Churn  . TUBAL LIGATION  03/03/89    Outpatient Medications Prior to Visit  Medication Sig Dispense Refill  . albuterol (VENTOLIN HFA) 108 (90 Base) MCG/ACT inhaler Inhale 2 puffs into the lungs every 6 (six) hours as needed for wheezing or shortness of breath. 1  Inhaler 0  . atorvastatin (LIPITOR) 20 MG tablet Take 1 tablet (20 mg total) by mouth daily. 90 tablet 0  . calcium-vitamin D (OSCAL WITH D) 250-125 MG-UNIT per tablet Take 1 tablet by mouth daily.    Marland Kitchen levothyroxine (SYNTHROID, LEVOTHROID) 112 MCG tablet Take 1 tablet (112 mcg total) by mouth daily before breakfast. 90 tablet 3  . lisinopril (PRINIVIL,ZESTRIL) 20 MG tablet Take 1 tablet (20 mg total) by mouth daily. 90 tablet 3  . loratadine (CLARITIN) 10 MG tablet Take 10 mg by mouth.    . Multiple Vitamins-Minerals (MULTIVITAMIN PO) Take by mouth.    . pantoprazole (PROTONIX) 40 MG tablet Take 1 tablet (40 mg total) by mouth daily. 90 tablet 1   No facility-administered medications prior to visit.     No Known Allergies  ROS As per HPI  PE: Blood pressure (!) 176/70, pulse 73, temperature 98.4 F (36.9 C), temperature source Oral, resp. rate 16, height 5' (1.524 m), weight 182 lb 6 oz (82.7 kg), SpO2 100 %. ENT: Ears: EACs clear, normal epithelium on right but flaky superficial dry desquamation in L EAC.  No erythema, swelling, or d/c.  TMs with good light reflex and landmarks bilaterally.  Eyes: no injection, icteris, swelling, or exudate.  EOMI, PERRLA. Nose: no drainage or turbinate edema/swelling.  No injection or focal lesion.  Mouth: lips without lesion/swelling.  Oral mucosa pink and moist.  Dentition intact and without obvious caries or gingival swelling.  Oropharynx without erythema, exudate, or swelling.  CV: RRR, no m/r/g.   LUNGS: CTA bilat, nonlabored resps, good aeration in all lung fields. EXT: no clubbing or cyanosis.  1+ pitting edema bilat.   LABS:  Lab Results  Component Value Date   TSH 0.36 (L) 11/10/2017   Lab Results  Component Value Date   WBC 10.7 (H) 04/27/2016   HGB 14.0 04/27/2016   HCT 41.8 04/27/2016   MCV 89.1 04/27/2016   PLT 253.0 04/27/2016   Lab Results  Component Value Date   CREATININE 0.67 01/08/2018   BUN 10 01/08/2018   NA 140  01/08/2018   K 4.5 01/08/2018   CL 103 01/08/2018   CO2 24 01/08/2018   Lab Results  Component Value Date   ALT 12 01/08/2018   AST 11 01/08/2018   ALKPHOS 58 04/27/2016   BILITOT 0.3 01/08/2018   Lab Results  Component Value Date   CHOL 137 04/04/2018   Lab Results  Component Value Date   HDL 48.60 04/04/2018   Lab Results  Component Value Date   LDLCALC 70 04/04/2018   Lab Results  Component Value Date   TRIG 92.0 04/04/2018   Lab Results  Component Value Date   CHOLHDL 3 04/04/2018   Lab Results  Component Value Date   HGBA1C 5.8 10/02/2015    IMPRESSION AND PLAN:  1) HTN: some elevated bp's on/off lately related to excess stress. No change in meds today.  Continue home monitoring and if consistently >140/90 she'll call or return.  2) HLD: tolerating statin.  Great response to atorva 20mg  qd per last lipid panel check 3 mo ago.  3) Hypothyroidism, postsurgical: recheck TSH today.  Last TSH 9 mo ago was at the lowest limit of normal, no dose change was made at that time.  4) Left>right EAC dermatitis: mild.  Will try cortisporin otc x 7d to see if this helps.  5) Vag d/c: description more consistent with BV than yeast vaginitis: will treat with metronidazole 500 mg bid x 7d.  An After Visit Summary was printed and given to the patient.  FOLLOW UP: Return in about 6 months (around 01/03/2019) for annual CPE (fasting).  Signed:  Crissie Sickles, MD           07/03/2018

## 2018-07-04 ENCOUNTER — Other Ambulatory Visit: Payer: Self-pay

## 2018-07-04 ENCOUNTER — Encounter: Payer: Self-pay | Admitting: *Deleted

## 2018-07-04 LAB — BASIC METABOLIC PANEL
BUN: 11 mg/dL (ref 7–25)
CALCIUM: 9.5 mg/dL (ref 8.6–10.4)
CO2: 28 mmol/L (ref 20–32)
CREATININE: 0.66 mg/dL (ref 0.50–0.99)
Chloride: 105 mmol/L (ref 98–110)
Glucose, Bld: 102 mg/dL — ABNORMAL HIGH (ref 65–99)
POTASSIUM: 4.1 mmol/L (ref 3.5–5.3)
SODIUM: 141 mmol/L (ref 135–146)

## 2018-07-04 LAB — EXTRA LAV TOP TUBE

## 2018-07-04 LAB — TSH: TSH: 0.34 mIU/L — ABNORMAL LOW (ref 0.40–4.50)

## 2018-07-04 MED ORDER — ATORVASTATIN CALCIUM 20 MG PO TABS: 20.0000 mg | ORAL_TABLET | Freq: Every day | ORAL | 1 refills | Status: DC

## 2018-07-06 ENCOUNTER — Other Ambulatory Visit: Payer: Self-pay | Admitting: Family Medicine

## 2018-08-14 ENCOUNTER — Ambulatory Visit (INDEPENDENT_AMBULATORY_CARE_PROVIDER_SITE_OTHER): Payer: Medicare HMO

## 2018-08-14 DIAGNOSIS — Z23 Encounter for immunization: Secondary | ICD-10-CM | POA: Diagnosis not present

## 2018-09-18 DIAGNOSIS — R69 Illness, unspecified: Secondary | ICD-10-CM | POA: Diagnosis not present

## 2018-12-06 DIAGNOSIS — R69 Illness, unspecified: Secondary | ICD-10-CM | POA: Diagnosis not present

## 2018-12-19 ENCOUNTER — Other Ambulatory Visit: Payer: Self-pay | Admitting: Family Medicine

## 2018-12-31 ENCOUNTER — Encounter: Payer: Self-pay | Admitting: Family Medicine

## 2018-12-31 ENCOUNTER — Ambulatory Visit (INDEPENDENT_AMBULATORY_CARE_PROVIDER_SITE_OTHER): Payer: Medicare HMO | Admitting: Family Medicine

## 2018-12-31 VITALS — BP 120/86 | HR 74 | Temp 97.6°F | Resp 16 | Ht 60.0 in | Wt 180.0 lb

## 2018-12-31 DIAGNOSIS — E7800 Pure hypercholesterolemia, unspecified: Secondary | ICD-10-CM | POA: Diagnosis not present

## 2018-12-31 DIAGNOSIS — E039 Hypothyroidism, unspecified: Secondary | ICD-10-CM | POA: Diagnosis not present

## 2018-12-31 DIAGNOSIS — Z1211 Encounter for screening for malignant neoplasm of colon: Secondary | ICD-10-CM

## 2018-12-31 DIAGNOSIS — E669 Obesity, unspecified: Secondary | ICD-10-CM | POA: Diagnosis not present

## 2018-12-31 DIAGNOSIS — Z23 Encounter for immunization: Secondary | ICD-10-CM | POA: Diagnosis not present

## 2018-12-31 DIAGNOSIS — I10 Essential (primary) hypertension: Secondary | ICD-10-CM | POA: Diagnosis not present

## 2018-12-31 DIAGNOSIS — Z0000 Encounter for general adult medical examination without abnormal findings: Secondary | ICD-10-CM | POA: Diagnosis not present

## 2018-12-31 MED ORDER — ZOSTER VAC RECOMB ADJUVANTED 50 MCG/0.5ML IM SUSR: 0.5000 mL | Freq: Once | INTRAMUSCULAR | 1 refills | Status: AC

## 2018-12-31 MED ORDER — ATORVASTATIN CALCIUM 20 MG PO TABS: 20.0000 mg | ORAL_TABLET | Freq: Every day | ORAL | 1 refills | Status: DC

## 2018-12-31 NOTE — Progress Notes (Signed)
Office Note 12/31/2018  CC:  Chief Complaint  Patient presents with  . Annual Exam    Pt is fasting.    HPI:  Marissa Gibbs is a 68 y.o. White female who is here for annual health maintenance exam.  No formal exercise. Vision and dental UTD. Diet is fair.  No acute complaints.    Past Medical History:  Diagnosis Date  . Abnormal mammogram of right breast 10/2015   Benign-appearing cysts on f/u diagnostic mammo and ultrasound: recommended stay on annual mammogram screening schedule  . Allergy   . Cervical cancer screening 12/2017   Pap normal-->no further pap screening indicated.  . Cervical polyp 12/2017   resected 01/2018 by Dr. Ailene Rud  . Colon cancer screening 01/10/2018   iFOB neg.  Repeat 01/2019  . Essential hypertension 2017  . Hypercholesterolemia 01/2018   Excellent response to 20mg  atorva 2019  . Hypothyroidism, postsurgical     Past Surgical History:  Procedure Laterality Date  . CERVICAL POLYPECTOMY  01/2018  . COLONOSCOPY  06/2007   Normal Sharlett Iles)  . DEXA  08/2006; 01/2018   2007; T -0.3.  2019 T score -0.5.  Marland Kitchen ENDOMETRIAL BIOPSY  12/1999   Benign  . THYROIDECTOMY  07/02/15   Dr. Kelli Churn  . TUBAL LIGATION  03/03/89    Family History  Problem Relation Age of Onset  . Arthritis Mother   . Breast cancer Mother 57  . Hypertension Mother   . Diabetes Mother   . Heart attack Father   . Diabetes Father   . Alcohol abuse Father   . Thyroid disease Neg Hx     Social History   Socioeconomic History  . Marital status: Married    Spouse name: Not on file  . Number of children: Not on file  . Years of education: Not on file  . Highest education level: Not on file  Occupational History  . Not on file  Social Needs  . Financial resource strain: Not on file  . Food insecurity:    Worry: Not on file    Inability: Not on file  . Transportation needs:    Medical: Not on file    Non-medical: Not on file  Tobacco Use  . Smoking  status: Never Smoker  . Smokeless tobacco: Never Used  Substance and Sexual Activity  . Alcohol use: No  . Drug use: No  . Sexual activity: Not on file  Lifestyle  . Physical activity:    Days per week: Not on file    Minutes per session: Not on file  . Stress: Not on file  Relationships  . Social connections:    Talks on phone: Not on file    Gets together: Not on file    Attends religious service: Not on file    Active member of club or organization: Not on file    Attends meetings of clubs or organizations: Not on file    Relationship status: Not on file  . Intimate partner violence:    Fear of current or ex partner: Not on file    Emotionally abused: Not on file    Physically abused: Not on file    Forced sexual activity: Not on file  Other Topics Concern  . Not on file  Social History Narrative   Married, one biologic daughter and one stepson.   Education: 12 grade.   Occupation: Woodworker Tax inspector).   No T/A/Ds.  Outpatient Medications Prior to Visit  Medication Sig Dispense Refill  . albuterol (VENTOLIN HFA) 108 (90 Base) MCG/ACT inhaler Inhale 2 puffs into the lungs every 6 (six) hours as needed for wheezing or shortness of breath. 1 Inhaler 0  . calcium-vitamin D (OSCAL WITH D) 250-125 MG-UNIT per tablet Take 1 tablet by mouth daily.    Marland Kitchen levothyroxine (SYNTHROID, LEVOTHROID) 112 MCG tablet TAKE 1 TABLET DAILY BEFORE BREAKFAST 90 tablet 1  . lisinopril (PRINIVIL,ZESTRIL) 20 MG tablet TAKE 1 TABLET DAILY 90 tablet 1  . loratadine (CLARITIN) 10 MG tablet Take 10 mg by mouth.    . Multiple Vitamins-Minerals (MULTIVITAMIN PO) Take by mouth.    . neomycin-polymyxin-hydrocortisone (CORTISPORIN) OTIC solution 3 drops in each ear twice per day for 7 days 10 mL 0  . pantoprazole (PROTONIX) 40 MG tablet TAKE 1 TABLET DAILY 90 tablet 1  . atorvastatin (LIPITOR) 20 MG tablet Take 1 tablet (20 mg total) by mouth daily. 90 tablet 1   No  facility-administered medications prior to visit.     No Known Allergies  ROS Review of Systems  Constitutional: Negative for appetite change, chills, fatigue and fever.  HENT: Negative for congestion, dental problem, ear pain and sore throat.   Eyes: Negative for discharge, redness and visual disturbance.  Respiratory: Negative for cough, chest tightness, shortness of breath and wheezing.   Cardiovascular: Negative for chest pain, palpitations and leg swelling.  Gastrointestinal: Negative for abdominal pain, blood in stool, diarrhea, nausea and vomiting.  Genitourinary: Negative for difficulty urinating, dysuria, flank pain, frequency, hematuria and urgency.  Musculoskeletal: Negative for arthralgias, back pain, joint swelling, myalgias and neck stiffness.  Skin: Negative for pallor and rash.  Neurological: Negative for dizziness, speech difficulty, weakness and headaches.  Hematological: Negative for adenopathy. Does not bruise/bleed easily.  Psychiatric/Behavioral: Negative for confusion and sleep disturbance. The patient is not nervous/anxious.     PE; Blood pressure 120/86, pulse 74, temperature 97.6 F (36.4 C), temperature source Oral, resp. rate 16, height 5' (1.524 m), weight 180 lb (81.6 kg), SpO2 100 %. Body mass index is 35.15 kg/m. Pt examined with Helayne Seminole, CMA, as chaperone.  Gen: Alert, well appearing.  Patient is oriented to person, place, time, and situation. AFFECT: pleasant, lucid thought and speech. ENT: Ears: EACs clear, normal epithelium.  TMs with good light reflex and landmarks bilaterally.  Eyes: no injection, icteris, swelling, or exudate.  EOMI, PERRLA. Nose: no drainage or turbinate edema/swelling.  No injection or focal lesion.  Mouth: lips without lesion/swelling.  Oral mucosa pink and moist.  Dentition intact and without obvious caries or gingival swelling.  Oropharynx without erythema, exudate, or swelling.  Neck: supple/nontender.  No LAD,  mass, or TM.  Carotid pulses 2+ bilaterally, without bruits. CV: RRR, no m/r/g.   LUNGS: CTA bilat, nonlabored resps, good aeration in all lung fields. ABD: soft, NT, ND, BS normal.  No hepatospenomegaly or mass.  No bruits. EXT: no clubbing, cyanosis, or edema.  Musculoskeletal: no joint swelling, erythema, warmth, or tenderness.  ROM of all joints intact. Skin - no sores or suspicious lesions or rashes or color changes   Pertinent labs:    Chemistry      Component Value Date/Time   NA 141 07/03/2018 0818   K 4.1 07/03/2018 0818   CL 105 07/03/2018 0818   CO2 28 07/03/2018 0818   BUN 11 07/03/2018 0818   CREATININE 0.66 07/03/2018 0818      Component Value Date/Time  CALCIUM 9.5 07/03/2018 0818   ALKPHOS 58 04/27/2016 1558   AST 11 01/08/2018 0803   ALT 12 01/08/2018 0803   BILITOT 0.3 01/08/2018 0803     Lab Results  Component Value Date   CHOL 137 04/04/2018   HDL 48.60 04/04/2018   LDLCALC 70 04/04/2018   TRIG 92.0 04/04/2018   CHOLHDL 3 04/04/2018   Lab Results  Component Value Date   TSH 0.34 (L) 07/03/2018   Lab Results  Component Value Date   WBC 10.7 (H) 04/27/2016   HGB 14.0 04/27/2016   HCT 41.8 04/27/2016   MCV 89.1 04/27/2016   PLT 253.0 04/27/2016     ASSESSMENT AND PLAN:   Health maintenance exam: Reviewed age and gender appropriate health maintenance issues (prudent diet, regular exercise, health risks of tobacco and excessive alcohol, use of seatbelts, fire alarms in home, use of sunscreen).  Also reviewed age and gender appropriate health screening as well as vaccine recommendations. Vaccines: pneumovax 23--> given today    Shingrix--> rx sent to pt's pharmacy today. Labs: fasting HP Cervical ca screening: no further cerv ca screening indicated for her. Breast ca screening: due 01/2019-->she'll schedule. DEXA 2019 T-score of - 0.5.   Plan repeat 2021. Colon ca screening: pt due for repeat iFOB  An After Visit Summary was printed and given  to the patient.  FOLLOW UP:  Return in about 6 months (around 07/01/2019) for routine chronic illness f/u.  Signed:  Crissie Sickles, MD           12/31/2018

## 2018-12-31 NOTE — Patient Instructions (Signed)

## 2019-01-01 ENCOUNTER — Other Ambulatory Visit: Payer: Self-pay | Admitting: *Deleted

## 2019-01-01 DIAGNOSIS — E039 Hypothyroidism, unspecified: Secondary | ICD-10-CM

## 2019-01-01 LAB — COMPREHENSIVE METABOLIC PANEL
AG Ratio: 1.8 (calc) (ref 1.0–2.5)
ALBUMIN MSPROF: 4.4 g/dL (ref 3.6–5.1)
ALT: 15 U/L (ref 6–29)
AST: 12 U/L (ref 10–35)
Alkaline phosphatase (APISO): 55 U/L (ref 33–130)
BUN: 10 mg/dL (ref 7–25)
CHLORIDE: 104 mmol/L (ref 98–110)
CO2: 27 mmol/L (ref 20–32)
CREATININE: 0.6 mg/dL (ref 0.50–0.99)
Calcium: 9.6 mg/dL (ref 8.6–10.4)
GLOBULIN: 2.5 g/dL (ref 1.9–3.7)
GLUCOSE: 102 mg/dL — AB (ref 65–99)
POTASSIUM: 4.8 mmol/L (ref 3.5–5.3)
SODIUM: 142 mmol/L (ref 135–146)
TOTAL PROTEIN: 6.9 g/dL (ref 6.1–8.1)
Total Bilirubin: 0.4 mg/dL (ref 0.2–1.2)

## 2019-01-01 LAB — CBC WITH DIFFERENTIAL/PLATELET
ABSOLUTE MONOCYTES: 429 {cells}/uL (ref 200–950)
BASOS ABS: 52 {cells}/uL (ref 0–200)
Basophils Relative: 0.8 %
EOS ABS: 150 {cells}/uL (ref 15–500)
Eosinophils Relative: 2.3 %
HEMATOCRIT: 39.7 % (ref 35.0–45.0)
HEMOGLOBIN: 13.4 g/dL (ref 11.7–15.5)
LYMPHS ABS: 2366 {cells}/uL (ref 850–3900)
MCH: 30.1 pg (ref 27.0–33.0)
MCHC: 33.8 g/dL (ref 32.0–36.0)
MCV: 89.2 fL (ref 80.0–100.0)
MPV: 10.6 fL (ref 7.5–12.5)
Monocytes Relative: 6.6 %
NEUTROS ABS: 3504 {cells}/uL (ref 1500–7800)
Neutrophils Relative %: 53.9 %
Platelets: 251 10*3/uL (ref 140–400)
RBC: 4.45 10*6/uL (ref 3.80–5.10)
RDW: 11.8 % (ref 11.0–15.0)
Total Lymphocyte: 36.4 %
WBC: 6.5 10*3/uL (ref 3.8–10.8)

## 2019-01-01 LAB — LIPID PANEL
CHOL/HDL RATIO: 2.9 (calc) (ref <5.0)
Cholesterol: 140 mg/dL (ref <200)
HDL: 48 mg/dL — ABNORMAL LOW (ref >50)
LDL CHOLESTEROL (CALC): 74 mg/dL
NON-HDL CHOLESTEROL (CALC): 92 mg/dL (ref <130)
Triglycerides: 94 mg/dL (ref <150)

## 2019-01-01 LAB — T4, FREE: FREE T4: 1.7 ng/dL (ref 0.8–1.8)

## 2019-01-01 LAB — TSH: TSH: 0.06 mIU/L — ABNORMAL LOW (ref 0.40–4.50)

## 2019-01-02 ENCOUNTER — Other Ambulatory Visit: Payer: Self-pay | Admitting: Family Medicine

## 2019-01-09 ENCOUNTER — Encounter: Payer: Self-pay | Admitting: Family Medicine

## 2019-01-09 ENCOUNTER — Other Ambulatory Visit (INDEPENDENT_AMBULATORY_CARE_PROVIDER_SITE_OTHER): Payer: Medicare HMO

## 2019-01-09 ENCOUNTER — Encounter: Payer: Self-pay | Admitting: *Deleted

## 2019-01-09 DIAGNOSIS — Z1211 Encounter for screening for malignant neoplasm of colon: Secondary | ICD-10-CM | POA: Diagnosis not present

## 2019-01-09 LAB — FECAL OCCULT BLOOD, IMMUNOCHEMICAL: Fecal Occult Bld: NEGATIVE

## 2019-02-13 ENCOUNTER — Other Ambulatory Visit: Payer: Self-pay | Admitting: Family Medicine

## 2019-02-13 DIAGNOSIS — Z1231 Encounter for screening mammogram for malignant neoplasm of breast: Secondary | ICD-10-CM

## 2019-02-21 ENCOUNTER — Telehealth: Payer: Self-pay | Admitting: Family Medicine

## 2019-02-21 NOTE — Telephone Encounter (Signed)
Patient had abnormal TSH - you asked her to come back in 2 months on 12/31/2018 for further thyroid testing.  Okay for her to come in 02/25/2019 for labs?  Please advise

## 2019-02-21 NOTE — Telephone Encounter (Signed)
Patient aware.  Lab appointment scheduled 02/25/2019.

## 2019-02-21 NOTE — Telephone Encounter (Signed)
Copied from [place] [phone]. Topic: Quick Communication - Lab Results (Clinic Use ONLY) >> Feb 21, 2019  8:19 AM Marissa Gibbs, Helene Kelp D wrote: Patient called and would like to talk to Dr. Anitra Lauth or his [facility] regarding coming in a week early to have her labs done. She said her husband has an appt next Monday and would like to come when he does. Please call patient back, thanks.

## 2019-02-21 NOTE — Telephone Encounter (Signed)
Yes, that's fine 

## 2019-02-25 ENCOUNTER — Other Ambulatory Visit: Payer: Self-pay

## 2019-02-25 ENCOUNTER — Other Ambulatory Visit (INDEPENDENT_AMBULATORY_CARE_PROVIDER_SITE_OTHER): Payer: Medicare HMO

## 2019-02-25 DIAGNOSIS — E039 Hypothyroidism, unspecified: Secondary | ICD-10-CM | POA: Diagnosis not present

## 2019-02-26 LAB — T4: T4 TOTAL: 11.2 ug/dL (ref 5.1–11.9)

## 2019-02-26 LAB — TSH: TSH: 0.98 mIU/L (ref 0.40–4.50)

## 2019-03-04 ENCOUNTER — Other Ambulatory Visit: Payer: Medicare HMO

## 2019-03-11 ENCOUNTER — Ambulatory Visit: Payer: Medicare HMO

## 2019-05-06 ENCOUNTER — Ambulatory Visit
Admission: RE | Admit: 2019-05-06 | Discharge: 2019-05-06 | Disposition: A | Discharge status: Home / Self Care | Payer: Medicare HMO | Source: Ambulatory Visit | Attending: Family Medicine | Admitting: Family Medicine

## 2019-05-06 ENCOUNTER — Other Ambulatory Visit: Payer: Self-pay

## 2019-05-06 DIAGNOSIS — Z1231 Encounter for screening mammogram for malignant neoplasm of breast: Secondary | ICD-10-CM

## 2019-06-03 DIAGNOSIS — E7800 Pure hypercholesterolemia, unspecified: Secondary | ICD-10-CM | POA: Diagnosis not present

## 2019-06-03 DIAGNOSIS — H1789 Other corneal scars and opacities: Secondary | ICD-10-CM | POA: Diagnosis not present

## 2019-06-03 DIAGNOSIS — I10 Essential (primary) hypertension: Secondary | ICD-10-CM | POA: Diagnosis not present

## 2019-06-03 DIAGNOSIS — H524 Presbyopia: Secondary | ICD-10-CM | POA: Diagnosis not present

## 2019-06-03 DIAGNOSIS — H04123 Dry eye syndrome of bilateral lacrimal glands: Secondary | ICD-10-CM | POA: Diagnosis not present

## 2019-06-03 DIAGNOSIS — D23121 Other benign neoplasm of skin of left upper eyelid, including canthus: Secondary | ICD-10-CM | POA: Diagnosis not present

## 2019-06-10 ENCOUNTER — Other Ambulatory Visit: Payer: Self-pay | Admitting: Family Medicine

## 2019-07-01 ENCOUNTER — Ambulatory Visit: Payer: Medicare HMO | Admitting: Family Medicine

## 2019-07-03 ENCOUNTER — Encounter: Payer: Self-pay | Admitting: Family Medicine

## 2019-07-03 ENCOUNTER — Other Ambulatory Visit: Payer: Self-pay

## 2019-07-03 ENCOUNTER — Ambulatory Visit (INDEPENDENT_AMBULATORY_CARE_PROVIDER_SITE_OTHER): Payer: Medicare HMO | Admitting: Family Medicine

## 2019-07-03 VITALS — BP 153/76 | HR 74 | Temp 98.2°F | Resp 17 | Ht 60.0 in | Wt 188.2 lb

## 2019-07-03 DIAGNOSIS — R300 Dysuria: Secondary | ICD-10-CM

## 2019-07-03 DIAGNOSIS — N760 Acute vaginitis: Secondary | ICD-10-CM

## 2019-07-03 DIAGNOSIS — I10 Essential (primary) hypertension: Secondary | ICD-10-CM | POA: Diagnosis not present

## 2019-07-03 DIAGNOSIS — E039 Hypothyroidism, unspecified: Secondary | ICD-10-CM | POA: Diagnosis not present

## 2019-07-03 DIAGNOSIS — E7800 Pure hypercholesterolemia, unspecified: Secondary | ICD-10-CM | POA: Diagnosis not present

## 2019-07-03 DIAGNOSIS — E669 Obesity, unspecified: Secondary | ICD-10-CM | POA: Diagnosis not present

## 2019-07-03 DIAGNOSIS — N898 Other specified noninflammatory disorders of vagina: Secondary | ICD-10-CM

## 2019-07-03 DIAGNOSIS — B9689 Other specified bacterial agents as the cause of diseases classified elsewhere: Secondary | ICD-10-CM | POA: Diagnosis not present

## 2019-07-03 LAB — POCT URINALYSIS DIPSTICK
Bilirubin, UA: NEGATIVE
Blood, UA: NEGATIVE
Glucose, UA: NEGATIVE
Ketones, UA: NEGATIVE
Leukocytes, UA: NEGATIVE
Nitrite, UA: NEGATIVE
Protein, UA: NEGATIVE
Spec Grav, UA: 1.025 (ref 1.010–1.025)
Urobilinogen, UA: 0.2 E.U./dL
pH, UA: 6 (ref 5.0–8.0)

## 2019-07-03 MED ORDER — LISINOPRIL 30 MG PO TABS: 30.0000 mg | ORAL_TABLET | Freq: Every day | ORAL | 0 refills | Status: DC

## 2019-07-03 MED ORDER — METRONIDAZOLE 500 MG PO TABS: 500.0000 mg | ORAL_TABLET | Freq: Two times a day (BID) | ORAL | 0 refills | Status: AC

## 2019-07-03 NOTE — Progress Notes (Signed)
OFFICE VISIT  07/03/2019   CC:  Chief Complaint  Patient presents with  . Follow-up    RCI, pt is fasting  . Vaginal Itching    odor, itching when using the restroom. using the restroom frequent. colored discharge   HPI:    Patient is a 68 y.o. Caucasian female who presents for 6 mo f/u HTN, HLD, and hypothyroidism.  HLD: taking statin daily w/out problem. "I cut out a lot of stuff". Doing some toning exercises lately qod.  HTN: bp's low 353I systolic and 14E diastolic when she checks it at home.  She pays no attention to her HRs.  Hypoth: TSH low 12/2018 so I adjusted her dose of T4 down some and recheck TSH 02/2019 wnl. Takes med on empty stomach w/out any other meds.  New c/o: chronic vag d/c since menopause now has an odor the last 3-4 d.  Intermittent burning upon urination. Lots of itching.  The d/c is cloudy and light yellow lately but is usually clear.  Lots of itching.  No urgency, no frequency.  Urine a little darker than normal.    ROS: no CP, no SOB, no wheezing, no cough, no dizziness, no HAs, no rashes, no melena/hematochezia.  No polyuria or polydipsia.  No myalgias or arthralgias.   Past Medical History:  Diagnosis Date  . Abnormal mammogram of right breast 10/2015   Benign-appearing cysts on f/u diagnostic mammo and ultrasound: recommended stay on annual mammogram screening schedule  . Allergy   . Cervical cancer screening 12/2017   Pap normal-->no further pap screening indicated.  . Cervical polyp 12/2017   resected 01/2018 by Dr. Ailene Rud  . Colon cancer screening 01/10/2018   iFOB neg 01/2018.  iFOB neg 01/2019  . Essential hypertension 2017  . Hypercholesterolemia 01/2018   Excellent response to 20mg  atorva 2019  . Hypothyroidism, postsurgical     Past Surgical History:  Procedure Laterality Date  . CERVICAL POLYPECTOMY  01/2018  . COLONOSCOPY  06/2007   Normal Sharlett Iles)  . DEXA  08/2006; 01/2018   2007; T -0.3.  2019 T score -0.5.  Marland Kitchen  ENDOMETRIAL BIOPSY  12/1999   Benign  . THYROIDECTOMY  07/02/15   Dr. Kelli Churn  . TUBAL LIGATION  03/03/89    Outpatient Medications Prior to Visit  Medication Sig Dispense Refill  . albuterol (VENTOLIN HFA) 108 (90 Base) MCG/ACT inhaler Inhale 2 puffs into the lungs every 6 (six) hours as needed for wheezing or shortness of breath. 1 Inhaler 0  . atorvastatin (LIPITOR) 20 MG tablet TAKE 1 TABLET DAILY 90 tablet 1  . calcium-vitamin D (OSCAL WITH D) 250-125 MG-UNIT per tablet Take 1 tablet by mouth daily.    Marland Kitchen levothyroxine (SYNTHROID) 112 MCG tablet TAKE 1 TABLET DAILY BEFORE BREAKFAST 90 tablet 1  . loratadine (CLARITIN) 10 MG tablet Take 10 mg by mouth.    . Multiple Vitamins-Minerals (MULTIVITAMIN PO) Take by mouth.    . pantoprazole (PROTONIX) 40 MG tablet TAKE 1 TABLET DAILY 90 tablet 1  . lisinopril (ZESTRIL) 20 MG tablet TAKE 1 TABLET DAILY 90 tablet 1  . neomycin-polymyxin-hydrocortisone (CORTISPORIN) OTIC solution 3 drops in each ear twice per day for 7 days (Patient not taking: Reported on 07/03/2019) 10 mL 0   No facility-administered medications prior to visit.     No Known Allergies  ROS As per HPI  PE: Blood pressure (!) 153/76, pulse 74, temperature 98.2 F (36.8 C), temperature source Temporal, resp. rate 17, height 5' (1.524  m), weight 188 lb 4 oz (85.4 kg), SpO2 99 %. Body mass index is 36.77 kg/m.  Gen: Alert, well appearing.  Patient is oriented to person, place, time, and situation. AFFECT: pleasant, lucid thought and speech. No further exam today.  LABS:    Chemistry      Component Value Date/Time   NA 142 12/31/2018 0924   K 4.8 12/31/2018 0924   CL 104 12/31/2018 0924   CO2 27 12/31/2018 0924   BUN 10 12/31/2018 0924   CREATININE 0.60 12/31/2018 0924      Component Value Date/Time   CALCIUM 9.6 12/31/2018 0924   ALKPHOS 58 04/27/2016 1558   AST 12 12/31/2018 0924   ALT 15 12/31/2018 0924   BILITOT 0.4 12/31/2018 0924     Lab Results   Component Value Date   CHOL 140 12/31/2018   HDL 48 (L) 12/31/2018   LDLCALC 74 12/31/2018   TRIG 94 12/31/2018   CHOLHDL 2.9 12/31/2018   Lab Results  Component Value Date   WBC 6.5 12/31/2018   HGB 13.4 12/31/2018   HCT 39.7 12/31/2018   MCV 89.2 12/31/2018   PLT 251 12/31/2018   Lab Results  Component Value Date   TSH 0.98 02/25/2019   Lab Results  Component Value Date   HGBA1C 5.8 10/02/2015   CC UA today: normal   IMPRESSION AND PLAN:  1) Vaginal d/c->BV suspected based on her sx's->empiric flagyl 500mg  bid x 7d. UA today b/c dysuria-->normal. Sent urine for c/s.  2) HTN: not well controlled. Increase lisinopril to 30mg  qd. She is working on diet and exercise. No labs at this time. May need to check BMET in near future if further up-titration of ACE-I is needed.  3) Hypothyroidism: The current medical regimen is effective;  continue present plan and medications. TSH repeat at CPE in 6 mo.  4) HLD: tolerating statin.  Lipids excellent and hepatic panel normal 12/2018. Repeat in 6 mo.  An After Visit Summary was printed and given to the patient.  FOLLOW UP: Return in about 2 weeks (around 07/17/2019) for f/u HTN (in office). Next CPE due 6 mo.  Signed:  Crissie Sickles, MD           07/03/2019

## 2019-07-05 LAB — URINE CULTURE
MICRO NUMBER:: 717222
SPECIMEN QUALITY:: ADEQUATE

## 2019-07-17 ENCOUNTER — Ambulatory Visit (INDEPENDENT_AMBULATORY_CARE_PROVIDER_SITE_OTHER): Payer: Medicare HMO | Admitting: Family Medicine

## 2019-07-17 ENCOUNTER — Encounter: Payer: Self-pay | Admitting: Family Medicine

## 2019-07-17 ENCOUNTER — Other Ambulatory Visit: Payer: Self-pay

## 2019-07-17 VITALS — BP 140/70 | HR 70 | Temp 98.3°F | Resp 16 | Ht 60.0 in | Wt 188.2 lb

## 2019-07-17 DIAGNOSIS — B9689 Other specified bacterial agents as the cause of diseases classified elsewhere: Secondary | ICD-10-CM | POA: Diagnosis not present

## 2019-07-17 DIAGNOSIS — I10 Essential (primary) hypertension: Secondary | ICD-10-CM

## 2019-07-17 DIAGNOSIS — N760 Acute vaginitis: Secondary | ICD-10-CM

## 2019-07-17 MED ORDER — LISINOPRIL 30 MG PO TABS: 30.0000 mg | ORAL_TABLET | Freq: Every day | ORAL | 1 refills | Status: DC

## 2019-07-17 NOTE — Progress Notes (Signed)
OFFICE VISIT  07/17/2019   CC:  Chief Complaint  Patient presents with  . Follow-up    hypertension   HPI:    Patient is a 68 y.o. Caucasian female who presents for 2 week f/u HTN. Increased lisinopril to 30mg  qd last visit b/c systolics in the 811B. Also treated her with flagyl for likely BV.  BPs now consistently <130/80, HR 70s-80s.  Tolerating inc dose of lisin fine. No further vaginal or urinary complaints after taking flagyl.  Past Medical History:  Diagnosis Date  . Abnormal mammogram of right breast 10/2015   Benign-appearing cysts on f/u diagnostic mammo and ultrasound: recommended stay on annual mammogram screening schedule  . Allergy   . Cervical cancer screening 12/2017   Pap normal-->no further pap screening indicated.  . Cervical polyp 12/2017   resected 01/2018 by Dr. Ailene Rud  . Colon cancer screening 01/10/2018   iFOB neg 01/2018.  iFOB neg 01/2019  . Essential hypertension 2017  . Hypercholesterolemia 01/2018   Excellent response to 20mg  atorva 2019  . Hypothyroidism, postsurgical     Past Surgical History:  Procedure Laterality Date  . CERVICAL POLYPECTOMY  01/2018  . COLONOSCOPY  06/2007   Normal Sharlett Iles)  . DEXA  08/2006; 01/2018   2007; T -0.3.  2019 T score -0.5.  Marland Kitchen ENDOMETRIAL BIOPSY  12/1999   Benign  . THYROIDECTOMY  07/02/15   Dr. Kelli Churn  . TUBAL LIGATION  03/03/89    Outpatient Medications Prior to Visit  Medication Sig Dispense Refill  . atorvastatin (LIPITOR) 20 MG tablet TAKE 1 TABLET DAILY 90 tablet 1  . calcium-vitamin D (OSCAL WITH D) 250-125 MG-UNIT per tablet Take 1 tablet by mouth daily.    . cetirizine (ZYRTEC) 10 MG tablet Take 10 mg by mouth daily.    Marland Kitchen levothyroxine (SYNTHROID) 112 MCG tablet TAKE 1 TABLET DAILY BEFORE BREAKFAST 90 tablet 1  . Multiple Vitamins-Minerals (MULTIVITAMIN PO) Take by mouth.    . pantoprazole (PROTONIX) 40 MG tablet TAKE 1 TABLET DAILY 90 tablet 1  . lisinopril (ZESTRIL) 30 MG tablet Take  1 tablet (30 mg total) by mouth daily. 30 tablet 0  . albuterol (VENTOLIN HFA) 108 (90 Base) MCG/ACT inhaler Inhale 2 puffs into the lungs every 6 (six) hours as needed for wheezing or shortness of breath. (Patient not taking: Reported on 07/17/2019) 1 Inhaler 0  . neomycin-polymyxin-hydrocortisone (CORTISPORIN) OTIC solution 3 drops in each ear twice per day for 7 days (Patient not taking: Reported on 07/03/2019) 10 mL 0  . loratadine (CLARITIN) 10 MG tablet Take 10 mg by mouth.     No facility-administered medications prior to visit.     No Known Allergies  ROS As per HPI  PE: Blood pressure 140/70, pulse 70, temperature 98.3 F (36.8 C), temperature source Temporal, resp. rate 16, height 5' (1.524 m), weight 188 lb 3.2 oz (85.4 kg), SpO2 98 %. Gen: Alert, well appearing.  Patient is oriented to person, place, time, and situation. AFFECT: pleasant, lucid thought and speech. CV: RRR, no m/r/g.   LUNGS: CTA bilat, nonlabored resps, good aeration in all lung fields.   LABS:    Chemistry      Component Value Date/Time   NA 142 12/31/2018 0924   K 4.8 12/31/2018 0924   CL 104 12/31/2018 0924   CO2 27 12/31/2018 0924   BUN 10 12/31/2018 0924   CREATININE 0.60 12/31/2018 0924      Component Value Date/Time   CALCIUM 9.6 12/31/2018  0924   ALKPHOS 58 04/27/2016 1558   AST 12 12/31/2018 0924   ALT 15 12/31/2018 0924   BILITOT 0.4 12/31/2018 0924      IMPRESSION AND PLAN:  1) HTN,now well controlled. Continue lisinopril 30mg  qd.  2) Bacterial vaginosis: resolved with flagyl.  An After Visit Summary was printed and given to the patient.  FOLLOW UP: Return for keep appt for CPE set for 12/2019.  Signed:  Crissie Sickles, MD           07/17/2019

## 2019-07-26 ENCOUNTER — Other Ambulatory Visit: Payer: Self-pay | Admitting: Family Medicine

## 2019-08-14 ENCOUNTER — Ambulatory Visit: Payer: Medicare HMO

## 2019-08-14 ENCOUNTER — Ambulatory Visit (INDEPENDENT_AMBULATORY_CARE_PROVIDER_SITE_OTHER): Payer: Medicare HMO

## 2019-08-14 ENCOUNTER — Other Ambulatory Visit: Payer: Self-pay

## 2019-08-14 VITALS — BP 158/86 | HR 77 | Ht 60.0 in | Wt 187.2 lb

## 2019-08-14 DIAGNOSIS — Z1211 Encounter for screening for malignant neoplasm of colon: Secondary | ICD-10-CM | POA: Diagnosis not present

## 2019-08-14 DIAGNOSIS — E669 Obesity, unspecified: Secondary | ICD-10-CM | POA: Diagnosis not present

## 2019-08-14 DIAGNOSIS — Z23 Encounter for immunization: Secondary | ICD-10-CM

## 2019-08-14 DIAGNOSIS — Z0000 Encounter for general adult medical examination without abnormal findings: Secondary | ICD-10-CM

## 2019-08-14 NOTE — Progress Notes (Signed)
Subjective:   Marissa Gibbs is a 68 y.o. female who presents for an Initial Medicare Annual Wellness Visit.  Review of Systems    No ROS.  Medicare Wellness Visit.  See social history for additional risk factors.  Cardiac Risk Factors include: advanced age (>2mn, >>43women);family history of premature cardiovascular disease;dyslipidemia;hypertension;sedentary lifestyle;obesity (BMI >30kg/m2)   Sleep patterns: Sleeps 5-7 hours.  Home Safety/Smoke Alarms: Feels safe in home. Smoke alarms in place.  Living environment; residence and Firearm Safety: Lives with husband in single story home, rail at steps.  Seat Belt Safety/Bike Helmet: Wears seat belt.   Female:   [id number]      Mammo-05/06/2019, BI-RADS CATEGORY  1: Negative.    Dexa scan-01/15/2018, normal.        CCS-Colonoscopy 06/25/2007, normal. Recall 10 years.       Objective:    Today's Vitals   08/14/19 0854 08/14/19 0931  BP: (!) 154/88 (!) 158/86  Pulse: 77   SpO2: 98%   Weight: 187 lb 4 oz (84.9 kg)   Height: 5' (1.524 m)    Body mass index is 36.57 kg/m.  Advanced Directives 08/14/2019  Does Patient Have a Medical Advance Directive? No  Would patient like information on creating a medical advance directive? No - Patient declined    Current Medications (verified) Outpatient Encounter Medications as of 08/14/2019  Medication Sig  . atorvastatin (LIPITOR) 20 MG tablet TAKE 1 TABLET DAILY  . calcium-vitamin D (OSCAL WITH D) 250-125 MG-UNIT per tablet Take 1 tablet by mouth daily.  . cetirizine (ZYRTEC) 10 MG tablet Take 10 mg by mouth daily.  .Marland Kitchenlevothyroxine (SYNTHROID) 112 MCG tablet TAKE 1 TABLET DAILY BEFORE BREAKFAST  . lisinopril (ZESTRIL) 30 MG tablet Take 1 tablet (30 mg total) by mouth daily.  . Multiple Vitamins-Minerals (MULTIVITAMIN PO) Take by mouth.  . Omega-3 Fatty Acids (FISH OIL PO) Take by mouth.  . pantoprazole (PROTONIX) 40 MG tablet TAKE 1 TABLET DAILY  . albuterol (VENTOLIN HFA) 108 (90  Base) MCG/ACT inhaler Inhale 2 puffs into the lungs every 6 (six) hours as needed for wheezing or shortness of breath. (Patient not taking: Reported on 07/17/2019)  . neomycin-polymyxin-hydrocortisone (CORTISPORIN) OTIC solution 3 drops in each ear twice per day for 7 days (Patient not taking: Reported on 07/03/2019)   No facility-administered encounter medications on file as of 08/14/2019.     Allergies (verified) Patient has no known allergies.   History: Past Medical History:  Diagnosis Date  . Abnormal mammogram of right breast 10/2015   Benign-appearing cysts on f/u diagnostic mammo and ultrasound: recommended stay on annual mammogram screening schedule  . Allergy   . Cervical cancer screening 12/2017   Pap normal-->no further pap screening indicated.  . Cervical polyp 12/2017   resected 01/2018 by Dr. KAilene Rud . Colon cancer screening 01/10/2018   iFOB neg 01/2018.  iFOB neg 01/2019  . Essential hypertension 2017  . Hypercholesterolemia 01/2018   Excellent response to '20mg'$  atorva 2019  . Hypothyroidism, postsurgical    Past Surgical History:  Procedure Laterality Date  . CERVICAL POLYPECTOMY  01/2018  . COLONOSCOPY  06/2007   Normal (Sharlett Iles  . DEXA  08/2006; 01/2018   2007; T -0.3.  2019 T score -0.5.  .Marland KitchenENDOMETRIAL BIOPSY  12/1999   Benign  . THYROIDECTOMY  07/02/15   Dr. BKelli Churn . TUBAL LIGATION  03/03/89   Family History  Problem Relation Age of Onset  . Arthritis Mother   .  Breast cancer Mother 37  . Hypertension Mother   . Diabetes Mother   . Heart attack Father   . Diabetes Father   . Alcohol abuse Father   . Thyroid disease Neg Hx    Social History   Socioeconomic History  . Marital status: Married    Spouse name: Not on file  . Number of children: Not on file  . Years of education: Not on file  . Highest education level: Not on file  Occupational History  . Not on file  Social Needs  . Financial resource strain: Not on file  . Food  insecurity    Worry: Not on file    Inability: Not on file  . Transportation needs    Medical: Not on file    Non-medical: Not on file  Tobacco Use  . Smoking status: Never Smoker  . Smokeless tobacco: Never Used  Substance and Sexual Activity  . Alcohol use: No  . Drug use: No  . Sexual activity: Not on file  Lifestyle  . Physical activity    Days per week: Not on file    Minutes per session: Not on file  . Stress: Not on file  Relationships  . Social Herbalist on phone: Not on file    Gets together: Not on file    Attends religious service: Not on file    Active member of club or organization: Not on file    Attends meetings of clubs or organizations: Not on file    Relationship status: Not on file  Other Topics Concern  . Not on file  Social History Narrative   Married, one biologic daughter and one stepson.   Education: 12 grade.   Occupation: Woodworker Tax inspector).   No T/A/Ds.          Tobacco Counseling Counseling given: Not Answered   Activities of Daily Living In your present state of health, do you have any difficulty performing the following activities: 08/14/2019  Hearing? N  Vision? N  Difficulty concentrating or making decisions? N  Walking or climbing stairs? N  Dressing or bathing? N  Doing errands, shopping? N  Preparing Food and eating ? N  Using the Toilet? N  In the past six months, have you accidently leaked urine? N  Do you have problems with loss of bowel control? N  Managing your Medications? N  Managing your Finances? N  Housekeeping or managing your Housekeeping? N  Some recent data might be hidden     Immunizations and Health Maintenance Immunization History  Administered Date(s) Administered  . Fluad Quad(high Dose 65+) 08/14/2019  . Influenza, High Dose Seasonal PF 11/10/2017, 08/14/2018  . Influenza,inj,Quad PF,6+ Mos 10/02/2015, 10/10/2016  . Pneumococcal Conjugate-13 01/03/2018  . Pneumococcal  Polysaccharide-23 12/31/2018  . Tdap 07/29/2014   Health Maintenance Due  Topic Date Due  . COLONOSCOPY  06/24/2017  . INFLUENZA VACCINE  07/06/2019    Patient Care Team: Tammi Sou, MD as PCP - General (Family Medicine)  Indicate any recent Medical Services you may have received from other than [facility] providers in the past year (date may be approximate).     Assessment:   This is a routine wellness examination for Marissa Gibbs.  Hearing/Vision screen  Hearing Screening   '125Hz'$  '250Hz'$  '500Hz'$  '1000Hz'$  '2000Hz'$  '3000Hz'$  '4000Hz'$  '6000Hz'$  '8000Hz'$   Right ear:           Left ear:  Comments: Pt reports some hearing loss in large rooms.   Vision Screening Comments: Last exam 02/2019. [facility] in [place]. Wears glasses.   Dietary issues and exercise activities discussed: Current Exercise Habits: The patient does not participate in regular exercise at present, Exercise limited by: None identified   Diet (meal preparation, eat out, water intake, caffeinated beverages, dairy products, fruits and vegetables): Drinks diet soda and very little water.   Breakfast: gravy biscuits; eggs; sausage biscuit; pancake; coffee Lunch: skips Dinner: vegetables; pasta salad; chicken  Goals    . Patient Stated     Increase water intake.       Depression Screen PHQ 2/9 Scores 08/14/2019 12/31/2018 12/21/2017 11/10/2017  PHQ - 2 Score 0 0 1 1    Fall Risk Fall Risk  08/14/2019 12/31/2018 12/21/2017 11/10/2017  Falls in the past year? 0 0 No No  Number falls in past yr: 0 - - -  Injury with Fall? 0 - - -  Follow up Falls prevention discussed - - -     Cognitive Function: MMSE - Mini Mental State Exam 08/14/2019  Orientation to time 5  Orientation to Place 5  Registration 3  Attention/ Calculation 5  Recall 3  Language- name 2 objects 2  Language- repeat 1  Language- follow 3 step command 3  Language- read & follow direction 1  Write a sentence 1  Copy design 1  Total score 30         Screening Tests Health Maintenance  Topic Date Due  . COLONOSCOPY  06/24/2017  . INFLUENZA VACCINE  07/06/2019  . MAMMOGRAM  05/05/2021  . TETANUS/TDAP  07/29/2024  . DEXA SCAN  Completed  . Hepatitis C Screening  Completed  . PNA vac Low Risk Adult  Completed        Plan:     Complete Cologuard kit  Bring a copy of your living will and/or healthcare power of attorney to your next office visit.  Continue doing brain stimulating activities (puzzles, reading, adult coloring books, staying active) to keep memory sharp.   I have personally reviewed and noted the following in the patient's chart:   . Medical and social history . Use of alcohol, tobacco or illicit drugs  . Current medications and supplements . Functional ability and status . Nutritional status . Physical activity . Advanced directives . List of other physicians . Hospitalizations, surgeries, and ER visits in previous 12 months . Vitals . Screenings to include cognitive, depression, and falls . Referrals and appointments  In addition, I have reviewed and discussed with patient certain preventive protocols, quality metrics, and best practice recommendations. A written personalized care plan for preventive services as well as general preventive health recommendations were provided to patient.     Gerilyn Nestle, RN   08/14/2019    PCP Notes: -BP elevated x 2. Advised to monitor at home daily x 2 weeks. If continues to be elevated, call for appt.  -F/U with PCP 12/2019

## 2019-08-14 NOTE — Patient Instructions (Addendum)
Complete Cologuard kit  Bring a copy of your living will and/or healthcare power of attorney to your next office visit.  Continue doing brain stimulating activities (puzzles, reading, adult coloring books, staying active) to keep memory sharp.    Fall Prevention in the Home, Adult Falls can cause injuries. They can happen to people of all ages. There are many things you can do to make your home safe and to help prevent falls. Ask for help when making these changes, if needed. What actions can I take to prevent falls? General Instructions  Use good lighting in all rooms. Replace any light bulbs that burn out.  Turn on the lights when you go into a dark area. Use night-lights.  Keep items that you use often in easy-to-reach places. Lower the shelves around your home if necessary.  Set up your furniture so you have a clear path. Avoid moving your furniture around.  Do not have throw rugs and other things on the floor that can make you trip.  Avoid walking on wet floors.  If any of your floors are uneven, fix them.  Add color or contrast paint or tape to clearly mark and help you see: ? Any grab bars or handrails. ? First and last steps of stairways. ? Where the edge of each step is.  If you use a stepladder: ? Make sure that it is fully opened. Do not climb a closed stepladder. ? Make sure that both sides of the stepladder are locked into place. ? Ask someone to hold the stepladder for you while you use it.  If there are any pets around you, be aware of where they are. What can I do in the bathroom?      Keep the floor dry. Clean up any water that spills onto the floor as soon as it happens.  Remove soap buildup in the tub or shower regularly.  Use non-skid mats or decals on the floor of the tub or shower.  Attach bath mats securely with double-sided, non-slip rug tape.  If you need to sit down in the shower, use a plastic, non-slip stool.  Install grab bars by the  toilet and in the tub and shower. Do not use towel bars as grab bars. What can I do in the bedroom?  Make sure that you have a light by your bed that is easy to reach.  Do not use any sheets or blankets that are too big for your bed. They should not hang down onto the floor.  Have a firm chair that has side arms. You can use this for support while you get dressed. What can I do in the kitchen?  Clean up any spills right away.  If you need to reach something above you, use a strong step stool that has a grab bar.  Keep electrical cords out of the way.  Do not use floor polish or wax that makes floors slippery. If you must use wax, use non-skid floor wax. What can I do with my stairs?  Do not leave any items on the stairs.  Make sure that you have a light switch at the top of the stairs and the bottom of the stairs. If you do not have them, ask someone to add them for you.  Make sure that there are handrails on both sides of the stairs, and use them. Fix handrails that are broken or loose. Make sure that handrails are as long as the stairways.  Install non-slip  stair treads on all stairs in your home.  Avoid having throw rugs at the top or bottom of the stairs. If you do have throw rugs, attach them to the floor with carpet tape.  Choose a carpet that does not hide the edge of the steps on the stairway.  Check any carpeting to make sure that it is firmly attached to the stairs. Fix any carpet that is loose or worn. What can I do on the outside of my home?  Use bright outdoor lighting.  Regularly fix the edges of walkways and driveways and fix any cracks.  Remove anything that might make you trip as you walk through a door, such as a raised step or threshold.  Trim any bushes or trees on the path to your home.  Regularly check to see if handrails are loose or broken. Make sure that both sides of any steps have handrails.  Install guardrails along the edges of any raised decks  and porches.  Clear walking paths of anything that might make someone trip, such as tools or rocks.  Have any leaves, snow, or ice cleared regularly.  Use sand or salt on walking paths during winter.  Clean up any spills in your garage right away. This includes grease or oil spills. What other actions can I take?  Wear shoes that: ? Have a low heel. Do not wear high heels. ? Have rubber bottoms. ? Are comfortable and fit you well. ? Are closed at the toe. Do not wear open-toe sandals.  Use tools that help you move around (mobility aids) if they are needed. These include: ? Canes. ? Walkers. ? Scooters. ? Crutches.  Review your medicines with your doctor. Some medicines can make you feel dizzy. This can increase your chance of falling. Ask your doctor what other things you can do to help prevent falls. Where to find more information  Centers for Disease Control and Prevention, STEADI: https://garcia.biz/  [employer] on Aging: BrainJudge.co.uk Contact a doctor if:  You are afraid of falling at home.  You feel weak, drowsy, or dizzy at home.  You fall at home. Summary  There are many simple things that you can do to make your home safe and to help prevent falls.  Ways to make your home safe include removing tripping hazards and installing grab bars in the bathroom.  Ask for help when making these changes in your home. This information is not intended to replace advice given to you by your health care provider. Make sure you discuss any questions you have with your health care provider. Document Released: 09/17/2009 Document Revised: 03/14/2019 Document Reviewed: 07/06/2017 Elsevier Patient Education  2020 [place] Maintenance, Female Adopting a healthy lifestyle and getting preventive care are important in promoting health and wellness. Ask your health care provider about:  The right schedule for you to have regular tests and exams.   Things you can do on your own to prevent diseases and keep yourself healthy. What should I know about diet, weight, and exercise? Eat a healthy diet   Eat a diet that includes plenty of vegetables, fruits, low-fat dairy products, and lean protein.  Do not eat a lot of foods that are high in solid fats, added sugars, or sodium. Maintain a healthy weight Body mass index (BMI) is used to identify weight problems. It estimates body fat based on height and weight. Your health care provider can help determine your BMI and help you achieve or  maintain a healthy weight. Get regular exercise Get regular exercise. This is one of the most important things you can do for your health. Most adults should:  Exercise for at least 150 minutes each week. The exercise should increase your heart rate and make you sweat (moderate-intensity exercise).  Do strengthening exercises at least twice a week. This is in addition to the moderate-intensity exercise.  Spend less time sitting. Even light physical activity can be beneficial. Watch cholesterol and blood lipids Have your blood tested for lipids and cholesterol at 67 years of age, then have this test every 5 years. Have your cholesterol levels checked more often if:  Your lipid or cholesterol levels are high.  You are older than 67 years of age.  You are at high risk for heart disease. What should I know about cancer screening? Depending on your health history and family history, you may need to have cancer screening at various ages. This may include screening for:  Breast cancer.  Cervical cancer.  Colorectal cancer.  Skin cancer.  Lung cancer. What should I know about heart disease, diabetes, and high blood pressure? Blood pressure and heart disease  High blood pressure causes heart disease and increases the risk of stroke. This is more likely to develop in people who have high blood pressure readings, are of African descent, or are  overweight.  Have your blood pressure checked: ? Every 3-5 years if you are 61-36 years of age. ? Every year if you are 36 years old or older. Diabetes Have regular diabetes screenings. This checks your fasting blood sugar level. Have the screening done:  Once every three years after age 21 if you are at a normal weight and have a low risk for diabetes.  More often and at a younger age if you are overweight or have a high risk for diabetes. What should I know about preventing infection? Hepatitis B If you have a higher risk for hepatitis B, you should be screened for this virus. Talk with your health care provider to find out if you are at risk for hepatitis B infection. Hepatitis C Testing is recommended for:  Everyone born from [birth year] through 1964.  Anyone with known risk factors for hepatitis C. Sexually transmitted infections (STIs)  Get screened for STIs, including gonorrhea and chlamydia, if: ? You are sexually active and are younger than 67 years of age. ? You are older than 67 years of age and your health care provider tells you that you are at risk for this type of infection. ? Your sexual activity has changed since you were last screened, and you are at increased risk for chlamydia or gonorrhea. Ask your health care provider if you are at risk.  Ask your health care provider about whether you are at high risk for HIV. Your health care provider may recommend a prescription medicine to help prevent HIV infection. If you choose to take medicine to prevent HIV, you should first get tested for HIV. You should then be tested every 3 months for as long as you are taking the medicine. Pregnancy  If you are about to stop having your period (premenopausal) and you may become pregnant, seek counseling before you get pregnant.  Take 400 to 800 micrograms (mcg) of folic acid every day if you become pregnant.  Ask for birth control (contraception) if you want to prevent pregnancy.  Osteoporosis and menopause Osteoporosis is a disease in which the bones lose minerals and strength with aging. This  can result in bone fractures. If you are 63 years old or older, or if you are at risk for osteoporosis and fractures, ask your health care provider if you should:  Be screened for bone loss.  Take a calcium or vitamin D supplement to lower your risk of fractures.  Be given hormone replacement therapy (HRT) to treat symptoms of menopause. Follow these instructions at home: Lifestyle  Do not use any products that contain nicotine or tobacco, such as cigarettes, e-cigarettes, and chewing tobacco. If you need help quitting, ask your health care provider.  Do not use street drugs.  Do not share needles.  Ask your health care provider for help if you need support or information about quitting drugs. Alcohol use  Do not drink alcohol if: ? Your health care provider tells you not to drink. ? You are pregnant, may be pregnant, or are planning to become pregnant.  If you drink alcohol: ? Limit how much you use to 0-1 drink a day. ? Limit intake if you are breastfeeding.  Be aware of how much alcohol is in your drink. In the [place] one drink equals one 12 oz bottle of beer (355 mL), one 5 oz glass of wine (148 mL), or one 1 oz glass of hard liquor (44 mL). General instructions  Schedule regular health, dental, and eye exams.  Stay current with your vaccines.  Tell your health care provider if: ? You often feel depressed. ? You have ever been abused or do not feel safe at home. Summary  Adopting a healthy lifestyle and getting preventive care are important in promoting health and wellness.  Follow your health care provider's instructions about healthy diet, exercising, and getting tested or screened for diseases.  Follow your health care provider's instructions on monitoring your cholesterol and blood pressure. This information is not intended to replace advice given  to you by your health care provider. Make sure you discuss any questions you have with your health care provider. Document Released: 06/06/2011 Document Revised: 11/14/2018 Document Reviewed: 11/14/2018 Elsevier Patient Education  2020 [employer].

## 2019-08-15 NOTE — Addendum Note (Signed)
Addended by: Gerilyn Nestle on: 08/15/2019 04:20 PM   Modules accepted: Miquel Dunn

## 2019-08-21 DIAGNOSIS — R69 Illness, unspecified: Secondary | ICD-10-CM | POA: Diagnosis not present

## 2019-08-21 NOTE — Progress Notes (Signed)
AWV reviewed and agree. Signed:  Crissie Sickles, MD           08/21/2019

## 2019-08-22 DIAGNOSIS — Z1211 Encounter for screening for malignant neoplasm of colon: Secondary | ICD-10-CM | POA: Diagnosis not present

## 2019-08-30 LAB — COLOGUARD: Cologuard: NEGATIVE

## 2019-09-02 ENCOUNTER — Other Ambulatory Visit: Payer: Self-pay

## 2019-09-02 ENCOUNTER — Encounter: Payer: Self-pay | Admitting: Family Medicine

## 2019-09-02 DIAGNOSIS — Z1211 Encounter for screening for malignant neoplasm of colon: Secondary | ICD-10-CM

## 2019-12-01 ENCOUNTER — Other Ambulatory Visit: Payer: Self-pay | Admitting: Family Medicine

## 2019-12-15 ENCOUNTER — Other Ambulatory Visit: Payer: Self-pay | Admitting: Family Medicine

## 2020-01-01 ENCOUNTER — Other Ambulatory Visit: Payer: Self-pay | Admitting: Family Medicine

## 2020-01-01 NOTE — Telephone Encounter (Signed)
Pt has CPE appt tomorrow, will hold off in case dose change.

## 2020-01-02 ENCOUNTER — Encounter: Payer: Medicare HMO | Admitting: Family Medicine

## 2020-01-09 ENCOUNTER — Other Ambulatory Visit: Payer: Self-pay

## 2020-01-09 ENCOUNTER — Encounter: Payer: Self-pay | Admitting: Family Medicine

## 2020-01-09 ENCOUNTER — Encounter: Payer: Medicare HMO | Admitting: Family Medicine

## 2020-01-09 ENCOUNTER — Ambulatory Visit (INDEPENDENT_AMBULATORY_CARE_PROVIDER_SITE_OTHER): Payer: Medicare HMO | Admitting: Family Medicine

## 2020-01-09 VITALS — BP 146/86 | HR 78 | Temp 98.3°F | Resp 16 | Ht 60.0 in | Wt 182.4 lb

## 2020-01-09 DIAGNOSIS — E039 Hypothyroidism, unspecified: Secondary | ICD-10-CM

## 2020-01-09 DIAGNOSIS — Z0000 Encounter for general adult medical examination without abnormal findings: Secondary | ICD-10-CM | POA: Diagnosis not present

## 2020-01-09 DIAGNOSIS — I10 Essential (primary) hypertension: Secondary | ICD-10-CM

## 2020-01-09 DIAGNOSIS — E348 Other specified endocrine disorders: Secondary | ICD-10-CM

## 2020-01-09 DIAGNOSIS — E7800 Pure hypercholesterolemia, unspecified: Secondary | ICD-10-CM

## 2020-01-09 MED ORDER — LISINOPRIL 30 MG PO TABS: 30.0000 mg | ORAL_TABLET | Freq: Every day | ORAL | 1 refills | Status: DC

## 2020-01-09 MED ORDER — NEOMYCIN-POLYMYXIN-HC 3.5-10000-1 OT SOLN: OTIC | 1 refills | Status: DC

## 2020-01-09 NOTE — Patient Instructions (Signed)
Health Maintenance, Female Adopting a healthy lifestyle and getting preventive care are important in promoting health and wellness. Ask your health care provider about:  The right schedule for you to have regular tests and exams.  Things you can do on your own to prevent diseases and keep yourself healthy. What should I know about diet, weight, and exercise? Eat a healthy diet   Eat a diet that includes plenty of vegetables, fruits, low-fat dairy products, and lean protein.  Do not eat a lot of foods that are high in solid fats, added sugars, or sodium. Maintain a healthy weight Body mass index (BMI) is used to identify weight problems. It estimates body fat based on height and weight. Your health care provider can help determine your BMI and help you achieve or maintain a healthy weight. Get regular exercise Get regular exercise. This is one of the most important things you can do for your health. Most adults should:  Exercise for at least 150 minutes each week. The exercise should increase your heart rate and make you sweat (moderate-intensity exercise).  Do strengthening exercises at least twice a week. This is in addition to the moderate-intensity exercise.  Spend less time sitting. Even light physical activity can be beneficial. Watch cholesterol and blood lipids Have your blood tested for lipids and cholesterol at 68 years of age, then have this test every 5 years. Have your cholesterol levels checked more often if:  Your lipid or cholesterol levels are high.  You are older than 68 years of age.  You are at high risk for heart disease. What should I know about cancer screening? Depending on your health history and family history, you may need to have cancer screening at various ages. This may include screening for:  Breast cancer.  Cervical cancer.  Colorectal cancer.  Skin cancer.  Lung cancer. What should I know about heart disease, diabetes, and high blood  pressure? Blood pressure and heart disease  High blood pressure causes heart disease and increases the risk of stroke. This is more likely to develop in people who have high blood pressure readings, are of African descent, or are overweight.  Have your blood pressure checked: ? Every 3-5 years if you are 18-39 years of age. ? Every year if you are 40 years old or older. Diabetes Have regular diabetes screenings. This checks your fasting blood sugar level. Have the screening done:  Once every three years after age 40 if you are at a normal weight and have a low risk for diabetes.  More often and at a younger age if you are overweight or have a high risk for diabetes. What should I know about preventing infection? Hepatitis B If you have a higher risk for hepatitis B, you should be screened for this virus. Talk with your health care provider to find out if you are at risk for hepatitis B infection. Hepatitis C Testing is recommended for:  Everyone born from 1945 through 1965.  Anyone with known risk factors for hepatitis C. Sexually transmitted infections (STIs)  Get screened for STIs, including gonorrhea and chlamydia, if: ? You are sexually active and are younger than 68 years of age. ? You are older than 68 years of age and your health care provider tells you that you are at risk for this type of infection. ? Your sexual activity has changed since you were last screened, and you are at increased risk for chlamydia or gonorrhea. Ask your health care provider if   you are at risk.  Ask your health care provider about whether you are at high risk for HIV. Your health care provider may recommend a prescription medicine to help prevent HIV infection. If you choose to take medicine to prevent HIV, you should first get tested for HIV. You should then be tested every 3 months for as long as you are taking the medicine. Pregnancy  If you are about to stop having your period (premenopausal) and  you may become pregnant, seek counseling before you get pregnant.  Take 400 to 800 micrograms (mcg) of folic acid every day if you become pregnant.  Ask for birth control (contraception) if you want to prevent pregnancy. Osteoporosis and menopause Osteoporosis is a disease in which the bones lose minerals and strength with aging. This can result in bone fractures. If you are 65 years old or older, or if you are at risk for osteoporosis and fractures, ask your health care provider if you should:  Be screened for bone loss.  Take a calcium or vitamin D supplement to lower your risk of fractures.  Be given hormone replacement therapy (HRT) to treat symptoms of menopause. Follow these instructions at home: Lifestyle  Do not use any products that contain nicotine or tobacco, such as cigarettes, e-cigarettes, and chewing tobacco. If you need help quitting, ask your health care provider.  Do not use street drugs.  Do not share needles.  Ask your health care provider for help if you need support or information about quitting drugs. Alcohol use  Do not drink alcohol if: ? Your health care provider tells you not to drink. ? You are pregnant, may be pregnant, or are planning to become pregnant.  If you drink alcohol: ? Limit how much you use to 0-1 drink a day. ? Limit intake if you are breastfeeding.  Be aware of how much alcohol is in your drink. In the [place] one drink equals one 12 oz bottle of beer (355 mL), one 5 oz glass of wine (148 mL), or one 1 oz glass of hard liquor (44 mL). General instructions  Schedule regular health, dental, and eye exams.  Stay current with your vaccines.  Tell your health care provider if: ? You often feel depressed. ? You have ever been abused or do not feel safe at home. Summary  Adopting a healthy lifestyle and getting preventive care are important in promoting health and wellness.  Follow your health care provider's instructions about healthy  diet, exercising, and getting tested or screened for diseases.  Follow your health care provider's instructions on monitoring your cholesterol and blood pressure. This information is not intended to replace advice given to you by your health care provider. Make sure you discuss any questions you have with your health care provider. Document Revised: 11/14/2018 Document Reviewed: 11/14/2018 Elsevier Patient Education  2020 Elsevier Inc.  

## 2020-01-09 NOTE — Progress Notes (Signed)
Office Note 01/09/2020  CC:  Chief Complaint  Patient presents with  . Annual Exam    pt is fasting    HPI:  Marissa Gibbs is a 69 y.o. White female with HTN, HLD, hypothyroidism, and GERD who is here for annual health maintenance exam. GYN MD: Dr. Deatra Ina.  Home bp monitoring consistently around 130/80.   Past Medical History:  Diagnosis Date  . Abnormal mammogram of right breast 10/2015   Benign-appearing cysts on f/u diagnostic mammo and ultrasound: recommended stay on annual mammogram screening schedule  . Allergy   . Cervical cancer screening 12/2017   Pap normal-->no further pap screening indicated.  . Cervical polyp 12/2017   resected 01/2018 by Dr. Ailene Rud  . Colon cancer screening 01/10/2018   iFOB neg 01/2018.  iFOB neg 01/2019.  Cologuard NEG 08/2019.  Marland Kitchen Essential hypertension 2017  . Hypercholesterolemia 01/2018   Excellent response to 20mg  atorva 2019  . Hypothyroidism, postsurgical     Past Surgical History:  Procedure Laterality Date  . CERVICAL POLYPECTOMY  01/2018  . COLONOSCOPY  06/2007   Normal Sharlett Iles)  . DEXA  08/2006; 01/2018   2007; T -0.3.  2019 T score -0.5.  Marland Kitchen ENDOMETRIAL BIOPSY  12/1999   Benign  . THYROIDECTOMY  07/02/15   Dr. Kelli Churn  . TUBAL LIGATION  03/03/89    Family History  Problem Relation Age of Onset  . Arthritis Mother   . Breast cancer Mother 61  . Hypertension Mother   . Diabetes Mother   . Heart attack Father   . Diabetes Father   . Alcohol abuse Father   . Thyroid disease Neg Hx     Social History   Socioeconomic History  . Marital status: Married    Spouse name: Not on file  . Number of children: Not on file  . Years of education: Not on file  . Highest education level: Not on file  Occupational History  . Not on file  Tobacco Use  . Smoking status: Never Smoker  . Smokeless tobacco: Never Used  Substance and Sexual Activity  . Alcohol use: No  . Drug use: No  . Sexual activity: Not on file   Other Topics Concern  . Not on file  Social History Narrative   Married, one biologic daughter and one stepson.   Education: 12 grade.   Occupation: Woodworker Tax inspector).   No T/A/Ds.         Social Determinants of Health   Financial Resource Strain:   . Difficulty of Paying Living Expenses: Not on file  Food Insecurity:   . Worried About Charity fundraiser in the Last Year: Not on file  . Ran Out of Food in the Last Year: Not on file  Transportation Needs:   . Lack of Transportation (Medical): Not on file  . Lack of Transportation (Non-Medical): Not on file  Physical Activity:   . Days of Exercise per Week: Not on file  . Minutes of Exercise per Session: Not on file  Stress:   . Feeling of Stress : Not on file  Social Connections:   . Frequency of Communication with Friends and Family: Not on file  . Frequency of Social Gatherings with Friends and Family: Not on file  . Attends Religious Services: Not on file  . Active Member of Clubs or Organizations: Not on file  . Attends Archivist Meetings: Not on file  . Marital Status: Not on file  Intimate  Partner Violence:   . Fear of Current or Ex-Partner: Not on file  . Emotionally Abused: Not on file  . Physically Abused: Not on file  . Sexually Abused: Not on file    Outpatient Medications Prior to Visit  Medication Sig Dispense Refill  . atorvastatin (LIPITOR) 20 MG tablet TAKE 1 TABLET DAILY 90 tablet 0  . calcium-vitamin D (OSCAL WITH D) 250-125 MG-UNIT per tablet Take 1 tablet by mouth daily.    . cetirizine (ZYRTEC) 10 MG tablet Take 10 mg by mouth daily.    Marland Kitchen levothyroxine (SYNTHROID) 112 MCG tablet TAKE 1 TABLET DAILY BEFORE BREAKFAST 90 tablet 0  . Multiple Vitamins-Minerals (MULTIVITAMIN PO) Take by mouth.    . Omega-3 Fatty Acids (FISH OIL PO) Take by mouth.    . pantoprazole (PROTONIX) 40 MG tablet TAKE 1 TABLET DAILY 90 tablet 1  . lisinopril (ZESTRIL) 30 MG tablet Take 1 tablet (30 mg  total) by mouth daily. 90 tablet 1  . albuterol (VENTOLIN HFA) 108 (90 Base) MCG/ACT inhaler Inhale 2 puffs into the lungs every 6 (six) hours as needed for wheezing or shortness of breath. (Patient not taking: Reported on 07/17/2019) 1 Inhaler 0  . neomycin-polymyxin-hydrocortisone (CORTISPORIN) OTIC solution 3 drops in each ear twice per day for 7 days (Patient not taking: Reported on 07/03/2019) 10 mL 0   No facility-administered medications prior to visit.    No Known Allergies  ROS Review of Systems  Constitutional: Negative for appetite change, chills, fatigue and fever.  HENT: Negative for congestion, dental problem, ear pain and sore throat.   Eyes: Negative for discharge, redness and visual disturbance.  Respiratory: Negative for cough, chest tightness, shortness of breath and wheezing.   Cardiovascular: Negative for chest pain, palpitations and leg swelling.  Gastrointestinal: Negative for abdominal pain, blood in stool, diarrhea, nausea and vomiting.  Genitourinary: Negative for difficulty urinating, dysuria, flank pain, frequency, hematuria and urgency.  Musculoskeletal: Negative for arthralgias, back pain, joint swelling, myalgias and neck stiffness.  Skin: Negative for pallor and rash.  Neurological: Negative for dizziness, speech difficulty, weakness and headaches.  Hematological: Negative for adenopathy. Does not bruise/bleed easily.  Psychiatric/Behavioral: Negative for confusion and sleep disturbance. The patient is not nervous/anxious.     PE; Blood pressure (!) 146/86, pulse 78, temperature 98.3 F (36.8 C), temperature source Temporal, resp. rate 16, height 5' (1.524 m), weight 182 lb 6.4 oz (82.7 kg), SpO2 99 %. Body mass index is 35.62 kg/m. Exam chaperoned by Deveron Furlong, CMA.  Gen: Alert, well appearing.  Patient is oriented to person, place, time, and situation. AFFECT: pleasant, lucid thought and speech. ENT: Ears: EACs clear, normal epithelium.  TMs  with good light reflex and landmarks bilaterally.  Eyes: no injection, icteris, swelling, or exudate.  EOMI, PERRLA. Nose: no drainage or turbinate edema/swelling.  No injection or focal lesion.  Mouth: lips without lesion/swelling.  Oral mucosa pink and moist.  Dentition intact and without obvious caries or gingival swelling.  Oropharynx without erythema, exudate, or swelling.  Neck: supple/nontender.  No LAD, mass, or TM.  Carotid pulses 2+ bilaterally, without bruits. CV: RRR, no m/r/g.   LUNGS: CTA bilat, nonlabored resps, good aeration in all lung fields. ABD: soft, NT, ND, BS normal.  No hepatospenomegaly or mass.  No bruits. EXT: no clubbing, cyanosis, or edema.  Musculoskeletal: no joint swelling, erythema, warmth, or tenderness.  ROM of all joints intact. Skin - no sores or suspicious lesions or rashes or color changes  Pertinent labs:  Lab Results  Component Value Date   TSH 0.98 02/25/2019   Lab Results  Component Value Date   WBC 6.5 12/31/2018   HGB 13.4 12/31/2018   HCT 39.7 12/31/2018   MCV 89.2 12/31/2018   PLT 251 12/31/2018   Lab Results  Component Value Date   CREATININE 0.60 12/31/2018   BUN 10 12/31/2018   NA 142 12/31/2018   K 4.8 12/31/2018   CL 104 12/31/2018   CO2 27 12/31/2018   Lab Results  Component Value Date   ALT 15 12/31/2018   AST 12 12/31/2018   ALKPHOS 58 04/27/2016   BILITOT 0.4 12/31/2018   Lab Results  Component Value Date   CHOL 140 12/31/2018   Lab Results  Component Value Date   HDL 48 (L) 12/31/2018   Lab Results  Component Value Date   LDLCALC 74 12/31/2018   Lab Results  Component Value Date   TRIG 94 12/31/2018   Lab Results  Component Value Date   CHOLHDL 2.9 12/31/2018   Lab Results  Component Value Date   HGBA1C 5.8 10/02/2015    ASSESSMENT AND PLAN:   Health maintenance exam: Reviewed age and gender appropriate health maintenance issues (prudent diet, regular exercise, health risks of tobacco and  excessive alcohol, use of seatbelts, fire alarms in home, use of sunscreen).  Also reviewed age and gender appropriate health screening as well as vaccine recommendations. Vaccines: Pneumonia, flu, and Tdap all UTD.  Shingrix-->rx sent to her pharmacy at the time of last year's CPE-->pt could not afford it. Labs: HP labs ordered-she is fasting today. Cervical ca screening: per GYN MD->scheduled for pap 01/28/20. Breast ca screening: screening mammogram due 05/2020. Colon ca screening:  cologuard repeat planned for 08/2022. Osteoporosis screening: due for repeat DEXA as of 01/2020->pt wanted defer at this time.  HTN: well controlled as per pt's regular/routine home monitoring.    Vulvar lesion recently noted, she got exam by GYN, he wasn't exactly sure what it is per pt-"?cyst". Has recheck planned when she goes in for pap later this month.  An After Visit Summary was printed and given to the patient.  FOLLOW UP:  Return in about 6 months (around 07/08/2020) for routine chronic illness f/u.  Signed:  Crissie Sickles, MD           01/09/2020

## 2020-01-10 ENCOUNTER — Other Ambulatory Visit: Payer: Self-pay

## 2020-01-10 DIAGNOSIS — E039 Hypothyroidism, unspecified: Secondary | ICD-10-CM

## 2020-01-10 LAB — CBC WITH DIFFERENTIAL/PLATELET
Absolute Monocytes: 408 cells/uL (ref 200–950)
Basophils Absolute: 61 cells/uL (ref 0–200)
Basophils Relative: 0.9 %
Eosinophils Absolute: 136 cells/uL (ref 15–500)
Eosinophils Relative: 2 %
HCT: 40.3 % (ref 35.0–45.0)
Hemoglobin: 13.5 g/dL (ref 11.7–15.5)
Lymphs Abs: 2217 cells/uL (ref 850–3900)
MCH: 29.9 pg (ref 27.0–33.0)
MCHC: 33.5 g/dL (ref 32.0–36.0)
MCV: 89.4 fL (ref 80.0–100.0)
MPV: 10.5 fL (ref 7.5–12.5)
Monocytes Relative: 6 %
Neutro Abs: 3978 cells/uL (ref 1500–7800)
Neutrophils Relative %: 58.5 %
Platelets: 256 10*3/uL (ref 140–400)
RBC: 4.51 10*6/uL (ref 3.80–5.10)
RDW: 11.7 % (ref 11.0–15.0)
Total Lymphocyte: 32.6 %
WBC: 6.8 10*3/uL (ref 3.8–10.8)

## 2020-01-10 LAB — COMPREHENSIVE METABOLIC PANEL
AG Ratio: 1.5 (calc) (ref 1.0–2.5)
ALT: 12 U/L (ref 6–29)
AST: 14 U/L (ref 10–35)
Albumin: 4.1 g/dL (ref 3.6–5.1)
Alkaline phosphatase (APISO): 60 U/L (ref 37–153)
BUN: 10 mg/dL (ref 7–25)
CO2: 26 mmol/L (ref 20–32)
Calcium: 9.9 mg/dL (ref 8.6–10.4)
Chloride: 104 mmol/L (ref 98–110)
Creat: 0.63 mg/dL (ref 0.50–0.99)
Globulin: 2.7 g/dL (calc) (ref 1.9–3.7)
Glucose, Bld: 88 mg/dL (ref 65–99)
Potassium: 4.2 mmol/L (ref 3.5–5.3)
Sodium: 142 mmol/L (ref 135–146)
Total Bilirubin: 0.5 mg/dL (ref 0.2–1.2)
Total Protein: 6.8 g/dL (ref 6.1–8.1)

## 2020-01-10 LAB — LIPID PANEL
Cholesterol: 142 mg/dL (ref <200)
HDL: 49 mg/dL — ABNORMAL LOW (ref >=50)
LDL Cholesterol (Calc): 76 mg/dL (calc)
Non-HDL Cholesterol (Calc): 93 mg/dL (calc) (ref <130)
Total CHOL/HDL Ratio: 2.9 (calc) (ref <5.0)
Triglycerides: 85 mg/dL (ref <150)

## 2020-01-10 LAB — TSH: TSH: 0.12 mIU/L — ABNORMAL LOW (ref 0.40–4.50)

## 2020-01-28 ENCOUNTER — Telehealth: Payer: Self-pay | Admitting: Family Medicine

## 2020-01-28 DIAGNOSIS — E2839 Other primary ovarian failure: Secondary | ICD-10-CM

## 2020-01-28 DIAGNOSIS — Z6836 Body mass index (BMI) 36.0-36.9, adult: Secondary | ICD-10-CM | POA: Diagnosis not present

## 2020-01-28 DIAGNOSIS — Z1231 Encounter for screening mammogram for malignant neoplasm of breast: Secondary | ICD-10-CM

## 2020-01-28 DIAGNOSIS — Z124 Encounter for screening for malignant neoplasm of cervix: Secondary | ICD-10-CM | POA: Diagnosis not present

## 2020-01-28 DIAGNOSIS — Z01419 Encounter for gynecological examination (general) (routine) without abnormal findings: Secondary | ICD-10-CM | POA: Diagnosis not present

## 2020-01-28 NOTE — Telephone Encounter (Signed)
Patient had CPE appt on 01/09/20 and would like to have bone density and mammogram done. Please advise, thanks. If approved, please enter orders.

## 2020-01-28 NOTE — Telephone Encounter (Signed)
[facility] and mammogram but she said they need an order from Dr. Anitra Lauth before they will schedule it. She said Dr. Anitra Lauth told her she needed to get this done. Please advise.

## 2020-01-29 NOTE — Telephone Encounter (Signed)
Left detailed message on pt's home number. Okay per [facility]

## 2020-01-29 NOTE — Telephone Encounter (Signed)
OK. Her next mammogram is not due until June this year, so have her schedule the this and the bone density for around that time. She can call now to make the appointment.

## 2020-02-24 DIAGNOSIS — R69 Illness, unspecified: Secondary | ICD-10-CM | POA: Diagnosis not present

## 2020-02-25 ENCOUNTER — Other Ambulatory Visit: Payer: Self-pay

## 2020-02-25 ENCOUNTER — Ambulatory Visit (INDEPENDENT_AMBULATORY_CARE_PROVIDER_SITE_OTHER): Payer: Medicare HMO | Admitting: Family Medicine

## 2020-02-25 DIAGNOSIS — E039 Hypothyroidism, unspecified: Secondary | ICD-10-CM | POA: Diagnosis not present

## 2020-02-25 LAB — TSH: TSH: 1.49 u[IU]/mL (ref 0.35–4.50)

## 2020-02-26 ENCOUNTER — Other Ambulatory Visit: Payer: Self-pay | Admitting: Family Medicine

## 2020-02-26 MED ORDER — LEVOTHYROXINE SODIUM 112 MCG PO TABS: 112.0000 ug | ORAL_TABLET | Freq: Every day | ORAL | 1 refills | Status: DC

## 2020-02-29 ENCOUNTER — Other Ambulatory Visit: Payer: Self-pay | Admitting: Family Medicine

## 2020-03-30 DIAGNOSIS — Z0100 Encounter for examination of eyes and vision without abnormal findings: Secondary | ICD-10-CM | POA: Diagnosis not present

## 2020-03-30 DIAGNOSIS — H524 Presbyopia: Secondary | ICD-10-CM | POA: Diagnosis not present

## 2020-03-30 DIAGNOSIS — D23121 Other benign neoplasm of skin of left upper eyelid, including canthus: Secondary | ICD-10-CM | POA: Diagnosis not present

## 2020-03-30 DIAGNOSIS — H04123 Dry eye syndrome of bilateral lacrimal glands: Secondary | ICD-10-CM | POA: Diagnosis not present

## 2020-03-30 DIAGNOSIS — H5203 Hypermetropia, bilateral: Secondary | ICD-10-CM | POA: Diagnosis not present

## 2020-03-30 DIAGNOSIS — H2513 Age-related nuclear cataract, bilateral: Secondary | ICD-10-CM | POA: Diagnosis not present

## 2020-03-30 DIAGNOSIS — H52223 Regular astigmatism, bilateral: Secondary | ICD-10-CM | POA: Diagnosis not present

## 2020-03-30 DIAGNOSIS — H1789 Other corneal scars and opacities: Secondary | ICD-10-CM | POA: Diagnosis not present

## 2020-05-06 ENCOUNTER — Ambulatory Visit: Payer: Medicare HMO

## 2020-05-06 ENCOUNTER — Other Ambulatory Visit: Payer: Medicare HMO

## 2020-05-22 ENCOUNTER — Other Ambulatory Visit: Payer: Self-pay | Admitting: Family Medicine

## 2020-05-26 DIAGNOSIS — R32 Unspecified urinary incontinence: Secondary | ICD-10-CM | POA: Diagnosis not present

## 2020-05-26 DIAGNOSIS — K219 Gastro-esophageal reflux disease without esophagitis: Secondary | ICD-10-CM | POA: Diagnosis not present

## 2020-05-26 DIAGNOSIS — H6690 Otitis media, unspecified, unspecified ear: Secondary | ICD-10-CM | POA: Diagnosis not present

## 2020-05-26 DIAGNOSIS — Z6836 Body mass index (BMI) 36.0-36.9, adult: Secondary | ICD-10-CM | POA: Diagnosis not present

## 2020-05-26 DIAGNOSIS — J45909 Unspecified asthma, uncomplicated: Secondary | ICD-10-CM | POA: Diagnosis not present

## 2020-05-26 DIAGNOSIS — Z008 Encounter for other general examination: Secondary | ICD-10-CM | POA: Diagnosis not present

## 2020-05-26 DIAGNOSIS — E039 Hypothyroidism, unspecified: Secondary | ICD-10-CM | POA: Diagnosis not present

## 2020-05-26 DIAGNOSIS — I10 Essential (primary) hypertension: Secondary | ICD-10-CM | POA: Diagnosis not present

## 2020-06-04 ENCOUNTER — Telehealth: Payer: Self-pay

## 2020-06-04 NOTE — Telephone Encounter (Signed)
Patient states that her home health nurse came to see her this week and her BP was very elevated, she was told to call in and give the readings to Dr. Thinking her BP medication may need to be changed or adjusted   05/27/20 - 148/78 AM - 153/84 PM 05/28/20- 123/73 AM- 134/79 PM 05/29/20- 140/78 AM- 143/83 PM 05/30/20- 123/69 AM- 118/67 PM 05/31/20- 128/74 AM- 116/58 PM 06/01/20- 139/84 AM- 122/59 PM 06/02/20- 147/77 AM- 143/86PM 06/03/20- 144/70 AM- 145/62 PM 06/04/20-143/83 AM   Please call and advise

## 2020-06-04 NOTE — Telephone Encounter (Signed)
Her averages are fine. No change in meds.

## 2020-06-04 NOTE — Telephone Encounter (Signed)
Spoke with patient and the morning readings are before she takes her medicine. Please advise if any changes need to be made, thanks.

## 2020-06-05 NOTE — Telephone Encounter (Signed)
Patient advised and voiced understanding.  

## 2020-06-15 ENCOUNTER — Ambulatory Visit
Admission: RE | Admit: 2020-06-15 | Discharge: 2020-06-15 | Disposition: A | Discharge status: Home / Self Care | Payer: Medicare HMO | Source: Ambulatory Visit | Attending: Family Medicine | Admitting: Family Medicine

## 2020-06-15 ENCOUNTER — Other Ambulatory Visit: Payer: Self-pay

## 2020-06-15 DIAGNOSIS — E2839 Other primary ovarian failure: Secondary | ICD-10-CM

## 2020-06-15 DIAGNOSIS — Z780 Asymptomatic menopausal state: Secondary | ICD-10-CM | POA: Diagnosis not present

## 2020-06-15 DIAGNOSIS — Z1231 Encounter for screening mammogram for malignant neoplasm of breast: Secondary | ICD-10-CM | POA: Diagnosis not present

## 2020-06-29 ENCOUNTER — Other Ambulatory Visit: Payer: Self-pay | Admitting: Family Medicine

## 2020-07-10 ENCOUNTER — Other Ambulatory Visit: Payer: Self-pay

## 2020-07-13 ENCOUNTER — Ambulatory Visit (INDEPENDENT_AMBULATORY_CARE_PROVIDER_SITE_OTHER): Payer: Medicare HMO | Admitting: Family Medicine

## 2020-07-13 ENCOUNTER — Encounter: Payer: Self-pay | Admitting: Family Medicine

## 2020-07-13 ENCOUNTER — Other Ambulatory Visit: Payer: Self-pay

## 2020-07-13 VITALS — BP 150/86 | HR 74 | Temp 98.0°F | Resp 18 | Ht 60.0 in | Wt 187.2 lb

## 2020-07-13 DIAGNOSIS — E890 Postprocedural hypothyroidism: Secondary | ICD-10-CM | POA: Diagnosis not present

## 2020-07-13 DIAGNOSIS — E7800 Pure hypercholesterolemia, unspecified: Secondary | ICD-10-CM | POA: Diagnosis not present

## 2020-07-13 DIAGNOSIS — I10 Essential (primary) hypertension: Secondary | ICD-10-CM | POA: Diagnosis not present

## 2020-07-13 NOTE — Progress Notes (Signed)
OFFICE VISIT  07/13/2020   CC:  Chief Complaint  Patient presents with  . Follow-up    6 month follow up , no complaints-- fasting.  Marland Kitchen Hypertension    no headache , no dizziness with bp readings    HPI:    Patient is a 69 y.o. Caucasian female who presents for 6 mo f/u HTN, HLD, and hypothyroidism.  Hydrating well up until the last week. Has been gaining wt. Diet unchanged, walks daily like usual. She is considering nutrisystem.  HTN: avg 135/70s. HLD: tolerating statin. Hypoth: taking 112 mcg: 1 qd except takes 1.5 tabs on M/W/F. Takes on empty stomach by itself.  ROS: no fevers, no CP, no SOB, no wheezing, no cough, no dizziness, no HAs, no rashes, no melena/hematochezia.  No polyuria or polydipsia.  No myalgias or arthralgias.  No focal weakness, paresthesias, or tremors.  No acute vision or hearing abnormalities. No n/v/d or abd pain.  No palpitations.    Past Medical History:  Diagnosis Date  . Abnormal mammogram of right breast 10/2015   Benign-appearing cysts on f/u diagnostic mammo and ultrasound: recommended stay on annual mammogram screening schedule  . Allergy   . Cervical cancer screening 12/2017   Pap normal-->no further pap screening indicated.  . Cervical polyp 12/2017   resected 01/2018 by Dr. Ailene Rud  . Colon cancer screening 01/10/2018   iFOB neg 01/2018.  iFOB neg 01/2019.  Cologuard NEG 08/2019.  Marland Kitchen Essential hypertension 2017  . Hypercholesterolemia 01/2018   Excellent response to 20mg  atorva 2019  . Hypothyroidism, postsurgical     Past Surgical History:  Procedure Laterality Date  . CERVICAL POLYPECTOMY  01/2018  . COLONOSCOPY  06/2007   Normal Sharlett Iles)  . DEXA  08/2006; 01/2018; 06/2020   2007; T -0.3.  2019 T score -0.5. 2021 T score -0.8  . ENDOMETRIAL BIOPSY  12/1999   Benign  . THYROIDECTOMY  07/02/15   Dr. Kelli Churn  . TUBAL LIGATION  03/03/89    Outpatient Medications Prior to Visit  Medication Sig Dispense Refill  . albuterol  (VENTOLIN HFA) 108 (90 Base) MCG/ACT inhaler Inhale 2 puffs into the lungs every 6 (six) hours as needed for wheezing or shortness of breath. (Patient not taking: Reported on 07/17/2019) 1 Inhaler 0  . atorvastatin (LIPITOR) 20 MG tablet TAKE 1 TABLET DAILY 90 tablet 1  . calcium-vitamin D (OSCAL WITH D) 250-125 MG-UNIT per tablet Take 1 tablet by mouth daily.    . cetirizine (ZYRTEC) 10 MG tablet Take 10 mg by mouth daily.    Marland Kitchen levothyroxine (SYNTHROID) 112 MCG tablet Take 1 tablet (112 mcg total) by mouth daily before breakfast. 90 tablet 1  . lisinopril (ZESTRIL) 30 MG tablet TAKE 1 TABLET DAILY 90 tablet 1  . Multiple Vitamins-Minerals (MULTIVITAMIN PO) Take by mouth.    . neomycin-polymyxin-hydrocortisone (CORTISPORIN) OTIC solution 3 drops in each ear twice per day for 7 days 10 mL 1  . nystatin cream (MYCOSTATIN) Apply topically 2 (two) times daily.    . Omega-3 Fatty Acids (FISH OIL PO) Take by mouth.    . pantoprazole (PROTONIX) 40 MG tablet TAKE 1 TABLET DAILY 90 tablet 1  . triamcinolone cream (KENALOG) 0.1 % SMARTSIG:Sparingly Topical Twice Daily     No facility-administered medications prior to visit.    No Known Allergies  ROS As per HPI  PE: Vitals with BMI 07/13/2020 01/09/2020 08/14/2019  Height 5\' 0"  5\' 0"  -  Weight 187 lbs 3 oz 182 lbs  6 oz -  BMI 83.15 17.61 -  Systolic 607 371 062  Diastolic 86 86 86  Pulse 74 78 -  O2 sat 99% on RA today  Gen: Alert, well appearing.  Patient is oriented to person, place, time, and situation. AFFECT: pleasant, lucid thought and speech. CV: RRR, no m/r/g.   LUNGS: CTA bilat, nonlabored resps, good aeration in all lung fields. EXT: no clubbing or cyanosis.  no edema.    LABS:  Lab Results  Component Value Date   TSH 1.49 02/25/2020   Lab Results  Component Value Date   WBC 6.8 01/09/2020   HGB 13.5 01/09/2020   HCT 40.3 01/09/2020   MCV 89.4 01/09/2020   PLT 256 01/09/2020   Lab Results  Component Value Date    CREATININE 0.63 01/09/2020   BUN 10 01/09/2020   NA 142 01/09/2020   K 4.2 01/09/2020   CL 104 01/09/2020   CO2 26 01/09/2020   Lab Results  Component Value Date   ALT 12 01/09/2020   AST 14 01/09/2020   ALKPHOS 58 04/27/2016   BILITOT 0.5 01/09/2020   Lab Results  Component Value Date   CHOL 142 01/09/2020   Lab Results  Component Value Date   HDL 49 (L) 01/09/2020   Lab Results  Component Value Date   LDLCALC 76 01/09/2020   Lab Results  Component Value Date   TRIG 85 01/09/2020   Lab Results  Component Value Date   CHOLHDL 2.9 01/09/2020   Lab Results  Component Value Date   HGBA1C 5.8 10/02/2015    IMPRESSION AND PLAN:  1) HTN: The current medical regimen is effective;  continue present plan and medications. Home avg good, no changes at this time. Lytes/cr today.  2) Hypothyroidism: taking meds correctly. TSH today.  3) HLD: tolerating statin. No changes today. FLP today.  An After Visit Summary was printed and given to the patient.  FOLLOW UP: No follow-ups on file.  Signed:  Crissie Sickles, MD           07/13/2020

## 2020-07-14 ENCOUNTER — Other Ambulatory Visit: Payer: Self-pay

## 2020-07-14 DIAGNOSIS — I10 Essential (primary) hypertension: Secondary | ICD-10-CM

## 2020-07-14 LAB — BASIC METABOLIC PANEL
BUN/Creatinine Ratio: 14 (calc) (ref 6–22)
BUN: 14 mg/dL (ref 7–25)
CO2: 24 mmol/L (ref 20–32)
Calcium: 8.9 mg/dL (ref 8.6–10.4)
Chloride: 99 mmol/L (ref 98–110)
Creat: 1.02 mg/dL — ABNORMAL HIGH (ref 0.50–0.99)
Glucose, Bld: 92 mg/dL (ref 65–99)
Potassium: 4.3 mmol/L (ref 3.5–5.3)
Sodium: 133 mmol/L — ABNORMAL LOW (ref 135–146)

## 2020-07-14 LAB — LIPID PANEL
Cholesterol: 176 mg/dL (ref <200)
HDL: 52 mg/dL (ref >=50)
LDL Cholesterol (Calc): 100 mg/dL (calc) — ABNORMAL HIGH
Non-HDL Cholesterol (Calc): 124 mg/dL (calc) (ref <130)
Total CHOL/HDL Ratio: 3.4 (calc) (ref <5.0)
Triglycerides: 138 mg/dL (ref <150)

## 2020-07-14 LAB — TSH: TSH: 1.92 mIU/L (ref 0.40–4.50)

## 2020-07-29 ENCOUNTER — Other Ambulatory Visit: Payer: Self-pay

## 2020-07-29 ENCOUNTER — Ambulatory Visit (INDEPENDENT_AMBULATORY_CARE_PROVIDER_SITE_OTHER): Payer: Medicare HMO | Admitting: Family Medicine

## 2020-07-29 DIAGNOSIS — I10 Essential (primary) hypertension: Secondary | ICD-10-CM | POA: Diagnosis not present

## 2020-07-29 LAB — BASIC METABOLIC PANEL
BUN: 9 mg/dL (ref 6–23)
CO2: 30 mEq/L (ref 19–32)
Calcium: 9.9 mg/dL (ref 8.4–10.5)
Chloride: 102 mEq/L (ref 96–112)
Creatinine, Ser: 0.73 mg/dL (ref 0.40–1.20)
GFR: 79.35 mL/min (ref >60.00)
Glucose, Bld: 104 mg/dL — ABNORMAL HIGH (ref 70–99)
Potassium: 5 mEq/L (ref 3.5–5.1)
Sodium: 139 mEq/L (ref 135–145)

## 2020-08-18 ENCOUNTER — Ambulatory Visit: Payer: Medicare HMO

## 2020-08-20 ENCOUNTER — Other Ambulatory Visit: Payer: Self-pay | Admitting: Family Medicine

## 2020-11-10 ENCOUNTER — Telehealth: Payer: Self-pay | Admitting: Family Medicine

## 2020-11-10 DIAGNOSIS — R69 Illness, unspecified: Secondary | ICD-10-CM | POA: Diagnosis not present

## 2020-11-10 NOTE — Telephone Encounter (Signed)
Chart updated

## 2020-11-10 NOTE — Telephone Encounter (Signed)
Patient called to update her chart: She received the flu shot at [facility] today, 11/10/20.

## 2020-11-30 ENCOUNTER — Telehealth: Payer: Self-pay | Admitting: Family Medicine

## 2020-11-30 NOTE — Telephone Encounter (Signed)
Updated.

## 2020-11-30 NOTE — Telephone Encounter (Signed)
Patient called to let us know she received her first shingles vaccine today at [facility] in [place], [place].

## 2020-12-02 ENCOUNTER — Other Ambulatory Visit: Payer: Self-pay | Admitting: Family Medicine

## 2020-12-14 ENCOUNTER — Other Ambulatory Visit: Payer: Self-pay

## 2020-12-14 MED ORDER — LISINOPRIL 30 MG PO TABS: 30.0000 mg | ORAL_TABLET | Freq: Every day | ORAL | 1 refills | Status: DC

## 2020-12-16 ENCOUNTER — Telehealth: Payer: Self-pay

## 2020-12-16 MED ORDER — PANTOPRAZOLE SODIUM 40 MG PO TBEC: 40.0000 mg | DELAYED_RELEASE_TABLET | Freq: Every day | ORAL | 0 refills | Status: DC

## 2020-12-16 NOTE — Telephone Encounter (Signed)
Patient refill request.  She has not received meds that are showing filled 12/02/21.  [facility] stated they never received approval from our office  pantoprazole (PROTONIX) 40 MG tablet [[id number]]    [facility] [place] [place] AT Portal to

## 2020-12-16 NOTE — Telephone Encounter (Signed)
Rx sent to mail pharmacy

## 2020-12-18 ENCOUNTER — Encounter (HOSPITAL_COMMUNITY): Payer: Self-pay | Admitting: *Deleted

## 2020-12-18 ENCOUNTER — Other Ambulatory Visit: Payer: Self-pay

## 2020-12-18 ENCOUNTER — Emergency Department (HOSPITAL_COMMUNITY)
Admission: EM | Admit: 2020-12-18 | Discharge: 2020-12-18 | Disposition: A | Discharge status: Home / Self Care | Payer: Medicare HMO | Attending: Emergency Medicine | Admitting: Emergency Medicine

## 2020-12-18 DIAGNOSIS — Z79899 Other long term (current) drug therapy: Secondary | ICD-10-CM | POA: Insufficient documentation

## 2020-12-18 DIAGNOSIS — K649 Unspecified hemorrhoids: Secondary | ICD-10-CM | POA: Diagnosis not present

## 2020-12-18 DIAGNOSIS — K625 Hemorrhage of anus and rectum: Secondary | ICD-10-CM

## 2020-12-18 DIAGNOSIS — I10 Essential (primary) hypertension: Secondary | ICD-10-CM | POA: Insufficient documentation

## 2020-12-18 DIAGNOSIS — E039 Hypothyroidism, unspecified: Secondary | ICD-10-CM | POA: Diagnosis not present

## 2020-12-18 LAB — COMPREHENSIVE METABOLIC PANEL
ALT: 17 U/L (ref 0–44)
AST: 17 U/L (ref 15–41)
Albumin: 4.3 g/dL (ref 3.5–5.0)
Alkaline Phosphatase: 60 U/L (ref 38–126)
Anion gap: 8 (ref 5–15)
BUN: 11 mg/dL (ref 8–23)
CO2: 27 mmol/L (ref 22–32)
Calcium: 9.5 mg/dL (ref 8.9–10.3)
Chloride: 102 mmol/L (ref 98–111)
Creatinine, Ser: 0.72 mg/dL (ref 0.44–1.00)
GFR, Estimated: >60 mL/min (ref >60)
Glucose, Bld: 138 mg/dL — ABNORMAL HIGH (ref 70–99)
Potassium: 4.1 mmol/L (ref 3.5–5.1)
Sodium: 137 mmol/L (ref 135–145)
Total Bilirubin: 0.3 mg/dL (ref 0.3–1.2)
Total Protein: 8 g/dL (ref 6.5–8.1)

## 2020-12-18 LAB — CBC
HCT: 41.9 % (ref 36.0–46.0)
Hemoglobin: 13.5 g/dL (ref 12.0–15.0)
MCH: 30.3 pg (ref 26.0–34.0)
MCHC: 32.2 g/dL (ref 30.0–36.0)
MCV: 94.2 fL (ref 80.0–100.0)
Platelets: 246 10*3/uL (ref 150–400)
RBC: 4.45 MIL/uL (ref 3.87–5.11)
RDW: 12.1 % (ref 11.5–15.5)
WBC: 12.5 10*3/uL — ABNORMAL HIGH (ref 4.0–10.5)
nRBC: 0 % (ref 0.0–0.2)

## 2020-12-18 LAB — TYPE AND SCREEN
ABO/RH(D): A POS
Antibody Screen: NEGATIVE

## 2020-12-18 MED ORDER — DOCUSATE SODIUM 100 MG PO CAPS: 100.0000 mg | ORAL_CAPSULE | Freq: Two times a day (BID) | ORAL | 2 refills | Status: AC

## 2020-12-18 MED ORDER — HYDROCORTISONE ACETATE 25 MG RE SUPP: 25.0000 mg | Freq: Two times a day (BID) | RECTAL | 1 refills | Status: DC

## 2020-12-18 NOTE — ED Notes (Signed)
Orthostatic VS  0/10  Lying  169/55, 108,16  99 RA  Sitting 161/89, 111, 18 100 RA  Standing  171/101, 109, 18, 100 RA

## 2020-12-18 NOTE — ED Triage Notes (Signed)
Pt with bright red blood from rectum since 1500 this afternoon.  Pt with known hemorrhoid.

## 2020-12-18 NOTE — ED Notes (Signed)
Monitor pulse ox is not reading correctly after change of finger probe   Reads 79-81 percent on RA while dynomap is 99-100 RA

## 2020-12-19 NOTE — ED Provider Notes (Signed)
[facility] Provider Note   CSN: [id number] Arrival date & time: 12/18/20  1700     History Chief Complaint  Patient presents with  . Rectal Bleeding    Marissa Gibbs is a 70 y.o. female.  The history is provided by the patient. No language interpreter was used.  Rectal Bleeding Quality:  Bright red Amount:  Moderate Timing:  Constant Chronicity:  New Similar prior episodes: no   Relieved by:  Nothing Worsened by:  Nothing Associated symptoms: no fever and no vomiting   Risk factors: no anticoagulant use    Pt reports she noticed bright red blood after going to the bathroom. Pt reports she has a hemorrhoid that she thought was the cause.  Pt reports she had several episodes of diarrhea with blood    Past Medical History:  Diagnosis Date  . Abnormal mammogram of right breast 10/2015   Benign-appearing cysts on f/u diagnostic mammo and ultrasound: recommended stay on annual mammogram screening schedule  . Allergy   . Cervical cancer screening 12/2017   Pap normal-->no further pap screening indicated.  . Cervical polyp 12/2017   resected 01/2018 by Dr. Ailene Rud  . Colon cancer screening 01/10/2018   iFOB neg 01/2018.  iFOB neg 01/2019.  Cologuard NEG 08/2019.  Marland Kitchen Essential hypertension 2017  . Hypercholesterolemia 01/2018   Excellent response to 20mg  atorva 2019  . Hypothyroidism, postsurgical     Patient Active Problem List   Diagnosis Date Noted  . Obesity (BMI 30-39.9) 07/03/2019  . Environmental and seasonal allergies 12/20/2017  . Hypothyroidism, postsurgical 10/02/2015  . Elevated LDL cholesterol level 10/02/2015  . Colon cancer screening 10/02/2015  . Healthcare maintenance 10/02/2015  . Thyroid cyst 05/14/2015  . Goiter 04/17/2015  . Cervical cancer screening 07/31/2014  . Health maintenance examination 07/29/2014  . Elevated blood pressure reading without diagnosis of hypertension 07/29/2014    Past Surgical History:  Procedure  Laterality Date  . CERVICAL POLYPECTOMY  01/2018  . COLONOSCOPY  06/2007   Normal Sharlett Iles)  . DEXA  08/2006; 01/2018; 06/2020   2007; T -0.3.  2019 T score -0.5. 2021 T score -0.8  . ENDOMETRIAL BIOPSY  12/1999   Benign  . THYROIDECTOMY  07/02/15   Dr. Kelli Churn  . TUBAL LIGATION  03/03/89     OB History   No obstetric history on file.     Family History  Problem Relation Age of Onset  . Arthritis Mother   . Breast cancer Mother 55  . Hypertension Mother   . Diabetes Mother   . Heart attack Father   . Diabetes Father   . Alcohol abuse Father   . Thyroid disease Neg Hx     Social History   Tobacco Use  . Smoking status: Never Smoker  . Smokeless tobacco: Never Used  Vaping Use  . Vaping Use: Never used  Substance Use Topics  . Alcohol use: No  . Drug use: No    Home Medications Prior to Admission medications   Medication Sig Start Date End Date Taking? Authorizing Provider  docusate sodium (COLACE) 100 MG capsule Take 1 capsule (100 mg total) by mouth 2 (two) times daily. 12/18/20 12/18/21 Yes Fransico Meadow, PA-C  hydrocortisone (ANUSOL-HC) 25 MG suppository Place 1 suppository (25 mg total) rectally every 12 (twelve) hours. 12/18/20 12/18/21 Yes Fransico Meadow, PA-C  albuterol (VENTOLIN HFA) 108 (90 Base) MCG/ACT inhaler Inhale 2 puffs into the lungs every 6 (six) hours as needed for  wheezing or shortness of breath. Patient not taking: Reported on 07/17/2019 12/15/17   Tammi Sou, MD  atorvastatin (LIPITOR) 20 MG tablet TAKE 1 TABLET DAILY 08/20/20   McGowen, Adrian Blackwater, MD  calcium-vitamin D (OSCAL WITH D) 250-125 MG-UNIT per tablet Take 1 tablet by mouth daily.    [provider]  cetirizine (ZYRTEC) 10 MG tablet Take 10 mg by mouth daily.    [provider]  levothyroxine (SYNTHROID) 112 MCG tablet TAKE 1 TABLET DAILY BEFORE BREAKFAST 08/20/20   McGowen, Adrian Blackwater, MD  lisinopril (ZESTRIL) 30 MG tablet Take 1 tablet (30 mg total) by mouth  daily. 12/14/20   McGowen, Adrian Blackwater, MD  Multiple Vitamins-Minerals (MULTIVITAMIN PO) Take by mouth.    [provider]  neomycin-polymyxin-hydrocortisone (CORTISPORIN) OTIC solution 3 drops in each ear twice per day for 7 days 01/09/20   McGowen, Adrian Blackwater, MD  nystatin cream (MYCOSTATIN) Apply topically 2 (two) times daily. 06/11/20   [provider]  Omega-3 Fatty Acids (FISH OIL PO) Take by mouth.    [provider]  pantoprazole (PROTONIX) 40 MG tablet Take 1 tablet (40 mg total) by mouth daily. 12/16/20   McGowen, Adrian Blackwater, MD  triamcinolone cream (KENALOG) 0.1 % SMARTSIG:Sparingly Topical Twice Daily 06/11/20   [provider]    Allergies    Patient has no known allergies.  Review of Systems   Review of Systems  Constitutional: Negative for fever.  Gastrointestinal: Positive for blood in stool, diarrhea and hematochezia. Negative for vomiting.  All other systems reviewed and are negative.   Physical Exam Updated Vital Signs BP 139/70   Pulse 99   Temp (!) 97.5 F (36.4 C) (Oral)   Resp 20   Ht 5' (1.524 m)   Wt 83.9 kg   SpO2 (!) 81%   BMI 36.13 kg/m   Physical Exam Vitals and nursing note reviewed.  Constitutional:      Appearance: She is well-developed and well-nourished.  HENT:     Head: Normocephalic.  Eyes:     Extraocular Movements: EOM normal.  Pulmonary:     Effort: Pulmonary effort is normal.  Abdominal:     General: There is no distension.  Genitourinary:    Rectum: Normal.     Comments: Small hemorrhoid external, internal hemorrhoid,  Dried blood,   Musculoskeletal:        General: Normal range of motion.     Cervical back: Normal range of motion.  Skin:    General: Skin is warm.  Neurological:     General: No focal deficit present.     Mental Status: She is alert and oriented to person, place, and time.  Psychiatric:        Mood and Affect: Mood and affect normal.        Thought Content: Thought content normal.      ED Results / Procedures / Treatments   Labs (all labs ordered are listed, but only abnormal results are displayed) Labs Reviewed  COMPREHENSIVE METABOLIC PANEL - Abnormal; Notable for the following components:      Result Value   Glucose, Bld 138 (*)    All other components within normal limits  CBC - Abnormal; Notable for the following components:   WBC 12.5 (*)    All other components within normal limits  POC OCCULT BLOOD, ED  POC OCCULT BLOOD, ED  TYPE AND SCREEN    EKG None  Radiology No results found.  Procedures Procedures (including  critical care time)  Medications Ordered in ED Medications - No data to display  ED Course  I have reviewed the triage vital signs and the nursing notes.  Pertinent labs & imaging results that were available during my care of the patient were reviewed by me and considered in my medical decision making (see chart for details).    MDM Rules/Calculators/A&P                          MDM:  Hemocult is positive,  Cbc normal.  Vitals normal  Pt given rx for anusal and colace.  Pt advised to schedule to see GI for evaluation  Final Clinical Impression(s) / ED Diagnoses Final diagnoses:  Rectal bleeding  Hemorrhoids, unspecified hemorrhoid type    Rx / DC Orders ED Discharge Orders         Ordered    docusate sodium (COLACE) 100 MG capsule  2 times daily        12/18/20 1910    hydrocortisone (ANUSOL-HC) 25 MG suppository  Every 12 hours        12/18/20 1910        An After Visit Summary was printed and given to the patient.    Fransico Meadow, [place] 12/19/20 1355    Margette Fast, MD 12/20/20 1549

## 2020-12-23 ENCOUNTER — Telehealth: Payer: Self-pay | Admitting: Internal Medicine

## 2020-12-23 NOTE — Telephone Encounter (Signed)
Er referral

## 2020-12-23 NOTE — Telephone Encounter (Signed)
Ok to schedule new pt ov.  

## 2020-12-24 ENCOUNTER — Other Ambulatory Visit: Payer: Self-pay

## 2020-12-24 ENCOUNTER — Encounter: Payer: Self-pay | Admitting: Gastroenterology

## 2020-12-24 ENCOUNTER — Ambulatory Visit: Payer: Medicare HMO | Admitting: Gastroenterology

## 2020-12-24 DIAGNOSIS — K625 Hemorrhage of anus and rectum: Secondary | ICD-10-CM | POA: Insufficient documentation

## 2020-12-24 NOTE — Progress Notes (Signed)
Primary Care Physician:  Tammi Sou, MD  Referring Physician: [facility] Primary Gastroenterologist:  Dr. Abbey Chatters  Chief Complaint  Patient presents with  . Rectal Bleeding    Sunday morning last episode. Occas straining with BM. Went to ED Friday and was given stool softener and suppository. Had cologuard done 2020 and was negative. Last TCS 2008    HPI:   Marissa Gibbs is a 70 y.o. female presenting today at the request of [facility] due to rectal bleeding. Recently seen in ED with Hgb normal at 13.5. Last colonoscopy 2008 by Dr. Sharlett Iles with descending colon and sigmoid colon diverticulosis. Internal hemorrhoids. Otherwise normal.   States Friday acute onset of rectal bleeding when sitting on toilet. Has history of low-volume tissue hematochezia with wiping. Different than prior episodes. Went to urinate and still had bleeding from rectum. No abdominal pain. Bleeding was between bright red and burgundy. States Cologuard 2 years ago was negative. No constipation. Was given stool softener to take twice a day by ED. Seldom NSAIDs unless has a bad headache.   History of GERD but managed well with Protonix. No unexplained weight loss or lack of appetite.   Past Medical History:  Diagnosis Date  . Abnormal mammogram of right breast 10/2015   Benign-appearing cysts on f/u diagnostic mammo and ultrasound: recommended stay on annual mammogram screening schedule  . Allergy   . Cervical cancer screening 12/2017   Pap normal-->no further pap screening indicated.  . Cervical polyp 12/2017   resected 01/2018 by Dr. Ailene Rud  . Colon cancer screening 01/10/2018   iFOB neg 01/2018.  iFOB neg 01/2019.  Cologuard NEG 08/2019.  Marland Kitchen Essential hypertension 2017  . Hypercholesterolemia 01/2018   Excellent response to 20mg  atorva 2019  . Hypothyroidism, postsurgical     Past Surgical History:  Procedure Laterality Date  . CERVICAL POLYPECTOMY  01/2018  . COLONOSCOPY  06/2007    Normal Sharlett Iles)  . DEXA  08/2006; 01/2018; 06/2020   2007; T -0.3.  2019 T score -0.5. 2021 T score -0.8  . ENDOMETRIAL BIOPSY  12/1999   Benign  . THYROIDECTOMY  07/02/15   Dr. Kelli Churn  . TUBAL LIGATION  03/03/89    Current Outpatient Medications  Medication Sig Dispense Refill  . albuterol (VENTOLIN HFA) 108 (90 Base) MCG/ACT inhaler Inhale 2 puffs into the lungs every 6 (six) hours as needed for wheezing or shortness of breath. 1 Inhaler 0  . atorvastatin (LIPITOR) 20 MG tablet TAKE 1 TABLET DAILY 90 tablet 1  . calcium-vitamin D (OSCAL WITH D) 250-125 MG-UNIT per tablet Take 1 tablet by mouth daily.    . cetirizine (ZYRTEC) 10 MG tablet Take 10 mg by mouth daily.    Marland Kitchen docusate sodium (COLACE) 100 MG capsule Take 1 capsule (100 mg total) by mouth 2 (two) times daily. 60 capsule 2  . hydrocortisone (ANUSOL-HC) 25 MG suppository Place 1 suppository (25 mg total) rectally every 12 (twelve) hours. 12 suppository 1  . levothyroxine (SYNTHROID) 112 MCG tablet TAKE 1 TABLET DAILY BEFORE BREAKFAST 90 tablet 1  . lisinopril (ZESTRIL) 30 MG tablet Take 1 tablet (30 mg total) by mouth daily. 90 tablet 1  . Multiple Vitamins-Minerals (MULTIVITAMIN PO) Take by mouth.    . nystatin cream (MYCOSTATIN) Apply topically 2 (two) times daily.    . Omega-3 Fatty Acids (FISH OIL PO) Take by mouth.    . pantoprazole (PROTONIX) 40 MG tablet Take 1 tablet (40 mg total)  by mouth daily. 90 tablet 0  . triamcinolone cream (KENALOG) 0.1 % As needed    . neomycin-polymyxin-hydrocortisone (CORTISPORIN) OTIC solution 3 drops in each ear twice per day for 7 days (Patient not taking: Reported on 12/24/2020) 10 mL 1   No current facility-administered medications for this visit.    Allergies as of 12/24/2020  . (No Known Allergies)    Family History  Problem Relation Age of Onset  . Arthritis Mother   . Breast cancer Mother 31  . Hypertension Mother   . Diabetes Mother   . Heart attack Father   . Diabetes  Father   . Alcohol abuse Father   . Thyroid disease Neg Hx     Social History   Socioeconomic History  . Marital status: Married    Spouse name: Not on file  . Number of children: Not on file  . Years of education: Not on file  . Highest education level: Not on file  Occupational History  . Not on file  Tobacco Use  . Smoking status: Never Smoker  . Smokeless tobacco: Never Used  Vaping Use  . Vaping Use: Never used  Substance and Sexual Activity  . Alcohol use: No  . Drug use: No  . Sexual activity: Not on file  Other Topics Concern  . Not on file  Social History Narrative   Married, one biologic daughter and one stepson.   Education: 12 grade.   Occupation: Woodworker Tax inspector).   No T/A/Ds.         Social Determinants of Health   Financial Resource Strain: Not on file  Food Insecurity: Not on file  Transportation Needs: Not on file  Physical Activity: Not on file  Stress: Not on file  Social Connections: Not on file  Intimate Partner Violence: Not on file    Review of Systems: Gen: Denies any fever, chills, fatigue, weight loss, lack of appetite.  CV: Denies chest pain, heart palpitations, peripheral edema, syncope.  Resp: Denies shortness of breath at rest or with exertion. Denies wheezing or cough.  GI: see HPI GU : Denies urinary burning, urinary frequency, urinary hesitancy MS: Denies joint pain, muscle weakness, cramps, or limitation of movement.  Derm: Denies rash, itching, dry skin Psych: Denies depression, anxiety, memory loss, and confusion Heme: see HPI  Physical Exam: BP (!) 177/93   Pulse 98   Temp (!) 96.9 F (36.1 C)   Ht 5' (1.524 m)   Wt 181 lb 3.2 oz (82.2 kg)   BMI 35.39 kg/m  General:   Alert and oriented. Pleasant and cooperative. Well-nourished and well-developed.  Head:  Normocephalic and atraumatic. Eyes:  Without icterus, sclera clear and conjunctiva pink.  Ears:  Normal auditory acuity. Mouth:  Mask in  place Lungs:  Clear to auscultation bilaterally. No wheezes, rales, or rhonchi. No distress.  Heart:  S1, S2 present without murmurs appreciated.  Abdomen:  +BS, soft, non-tender and non-distended. No HSM noted. No guarding or rebound. No masses appreciated.  Rectal:  Deferred  Msk:  Symmetrical without gross deformities. Normal posture. Extremities:  Without edema. Neurologic:  Alert and  oriented x4;  grossly normal neurologically. Skin:  Intact without significant lesions or rashes. Psych:  Alert and cooperative. Normal mood and affect.  Lab Results  Component Value Date   WBC 12.5 (H) 12/18/2020   HGB 13.5 12/18/2020   HCT 41.9 12/18/2020   MCV 94.2 12/18/2020   PLT 246 12/18/2020     ASSESSMENT: Marissa Roys  R Gibbs is a 70 y.o. female presenting today with recent acute onset of painless, rectal bleeding, presenting to the ED recently and now presenting here for evaluation of rectal bleeding.  Rectal bleeding has tapered, and CBC normal in the ED. Known diverticula and internal hemorrhoids on last colonoscopy in 2008 by Dr. Sharlett Iles in [place], and I suspect she may have had a self-limiting diverticular bleed or benign anorectal source such as internal hemorrhoids. Clinical presentation not consistent with ischemic etiology.  We will pursue diagnostic colonoscopy in near future. She is to call with any concerns in the meantime.    PLAN: Proceed with colonoscopy by Dr. Abbey Chatters  in near future: the risks, benefits, and alternatives have been discussed with the patient in detail. The patient states understanding and desires to proceed.   Call if further rectal bleeding  Return in follow-up after colonoscopy  Annitta Needs, PhD, ANP-BC [facility]

## 2020-12-24 NOTE — Patient Instructions (Signed)
If you have any further rectal bleeding, please call us!  Continue to avoid constipation and straining.  We are arranging a colonoscopy in the near future with Dr. Abbey Chatters, and we will see you in follow-up thereafter!   It was a pleasure to see you today. I want to create trusting relationships with patients to provide genuine, compassionate, and quality care. I value your feedback. If you receive a survey regarding your visit,  I greatly appreciate you taking time to fill this out.   Annitta Needs, PhD, ANP-BC [facility]

## 2021-01-04 ENCOUNTER — Telehealth: Payer: Self-pay | Admitting: *Deleted

## 2021-01-04 NOTE — Telephone Encounter (Signed)
Patient called in. She wants to cancel procedure and did not want to reschedule. She states it is not in her finances at this time. She will call back when ready to r/s. Called endo and made aware. FYI to [employer]

## 2021-01-12 ENCOUNTER — Other Ambulatory Visit: Payer: Self-pay

## 2021-01-13 ENCOUNTER — Ambulatory Visit (INDEPENDENT_AMBULATORY_CARE_PROVIDER_SITE_OTHER): Payer: Medicare HMO | Admitting: Family Medicine

## 2021-01-13 ENCOUNTER — Encounter: Payer: Self-pay | Admitting: Family Medicine

## 2021-01-13 ENCOUNTER — Telehealth: Payer: Self-pay | Admitting: Internal Medicine

## 2021-01-13 VITALS — BP 136/81 | HR 84 | Temp 98.0°F | Resp 16 | Ht 60.0 in | Wt 180.4 lb

## 2021-01-13 DIAGNOSIS — I10 Essential (primary) hypertension: Secondary | ICD-10-CM | POA: Diagnosis not present

## 2021-01-13 DIAGNOSIS — R8290 Unspecified abnormal findings in urine: Secondary | ICD-10-CM | POA: Diagnosis not present

## 2021-01-13 DIAGNOSIS — K625 Hemorrhage of anus and rectum: Secondary | ICD-10-CM | POA: Diagnosis not present

## 2021-01-13 DIAGNOSIS — Z0000 Encounter for general adult medical examination without abnormal findings: Secondary | ICD-10-CM

## 2021-01-13 DIAGNOSIS — E7800 Pure hypercholesterolemia, unspecified: Secondary | ICD-10-CM | POA: Diagnosis not present

## 2021-01-13 DIAGNOSIS — E890 Postprocedural hypothyroidism: Secondary | ICD-10-CM | POA: Diagnosis not present

## 2021-01-13 LAB — POC URINALSYSI DIPSTICK (AUTOMATED)
Bilirubin, UA: NEGATIVE
Blood, UA: NEGATIVE
Glucose, UA: NEGATIVE
Ketones, UA: NEGATIVE
Nitrite, UA: NEGATIVE
Protein, UA: POSITIVE — AB
Spec Grav, UA: 1.02 (ref 1.010–1.025)
Urobilinogen, UA: 0.2 E.U./dL
pH, UA: 6 (ref 5.0–8.0)

## 2021-01-13 NOTE — Patient Instructions (Signed)
Health Maintenance, Female Adopting a healthy lifestyle and getting preventive care are important in promoting health and wellness. Ask your health care provider about:  The right schedule for you to have regular tests and exams.  Things you can do on your own to prevent diseases and keep yourself healthy. What should I know about diet, weight, and exercise? Eat a healthy diet  Eat a diet that includes plenty of vegetables, fruits, low-fat dairy products, and lean protein.  Do not eat a lot of foods that are high in solid fats, added sugars, or sodium.   Maintain a healthy weight Body mass index (BMI) is used to identify weight problems. It estimates body fat based on height and weight. Your health care provider can help determine your BMI and help you achieve or maintain a healthy weight. Get regular exercise Get regular exercise. This is one of the most important things you can do for your health. Most adults should:  Exercise for at least 150 minutes each week. The exercise should increase your heart rate and make you sweat (moderate-intensity exercise).  Do strengthening exercises at least twice a week. This is in addition to the moderate-intensity exercise.  Spend less time sitting. Even light physical activity can be beneficial. Watch cholesterol and blood lipids Have your blood tested for lipids and cholesterol at 69 years of age, then have this test every 5 years. Have your cholesterol levels checked more often if:  Your lipid or cholesterol levels are high.  You are older than 69 years of age.  You are at high risk for heart disease. What should I know about cancer screening? Depending on your health history and family history, you may need to have cancer screening at various ages. This may include screening for:  Breast cancer.  Cervical cancer.  Colorectal cancer.  Skin cancer.  Lung cancer. What should I know about heart disease, diabetes, and high blood  pressure? Blood pressure and heart disease  High blood pressure causes heart disease and increases the risk of stroke. This is more likely to develop in people who have high blood pressure readings, are of African descent, or are overweight.  Have your blood pressure checked: ? Every 3-5 years if you are 18-39 years of age. ? Every year if you are 40 years old or older. Diabetes Have regular diabetes screenings. This checks your fasting blood sugar level. Have the screening done:  Once every three years after age 40 if you are at a normal weight and have a low risk for diabetes.  More often and at a younger age if you are overweight or have a high risk for diabetes. What should I know about preventing infection? Hepatitis B If you have a higher risk for hepatitis B, you should be screened for this virus. Talk with your health care provider to find out if you are at risk for hepatitis B infection. Hepatitis C Testing is recommended for:  Everyone born from 1945 through 1965.  Anyone with known risk factors for hepatitis C. Sexually transmitted infections (STIs)  Get screened for STIs, including gonorrhea and chlamydia, if: ? You are sexually active and are younger than 69 years of age. ? You are older than 69 years of age and your health care provider tells you that you are at risk for this type of infection. ? Your sexual activity has changed since you were last screened, and you are at increased risk for chlamydia or gonorrhea. Ask your health care provider   if you are at risk.  Ask your health care provider about whether you are at high risk for HIV. Your health care provider may recommend a prescription medicine to help prevent HIV infection. If you choose to take medicine to prevent HIV, you should first get tested for HIV. You should then be tested every 3 months for as long as you are taking the medicine. Pregnancy  If you are about to stop having your period (premenopausal) and  you may become pregnant, seek counseling before you get pregnant.  Take 400 to 800 micrograms (mcg) of folic acid every day if you become pregnant.  Ask for birth control (contraception) if you want to prevent pregnancy. Osteoporosis and menopause Osteoporosis is a disease in which the bones lose minerals and strength with aging. This can result in bone fractures. If you are 65 years old or older, or if you are at risk for osteoporosis and fractures, ask your health care provider if you should:  Be screened for bone loss.  Take a calcium or vitamin D supplement to lower your risk of fractures.  Be given hormone replacement therapy (HRT) to treat symptoms of menopause. Follow these instructions at home: Lifestyle  Do not use any products that contain nicotine or tobacco, such as cigarettes, e-cigarettes, and chewing tobacco. If you need help quitting, ask your health care provider.  Do not use street drugs.  Do not share needles.  Ask your health care provider for help if you need support or information about quitting drugs. Alcohol use  Do not drink alcohol if: ? Your health care provider tells you not to drink. ? You are pregnant, may be pregnant, or are planning to become pregnant.  If you drink alcohol: ? Limit how much you use to 0-1 drink a day. ? Limit intake if you are breastfeeding.  Be aware of how much alcohol is in your drink. In the [place] one drink equals one 12 oz bottle of beer (355 mL), one 5 oz glass of wine (148 mL), or one 1 oz glass of hard liquor (44 mL). General instructions  Schedule regular health, dental, and eye exams.  Stay current with your vaccines.  Tell your health care provider if: ? You often feel depressed. ? You have ever been abused or do not feel safe at home. Summary  Adopting a healthy lifestyle and getting preventive care are important in promoting health and wellness.  Follow your health care provider's instructions about healthy  diet, exercising, and getting tested or screened for diseases.  Follow your health care provider's instructions on monitoring your cholesterol and blood pressure. This information is not intended to replace advice given to you by your health care provider. Make sure you discuss any questions you have with your health care provider. Document Revised: 11/14/2018 Document Reviewed: 11/14/2018 Elsevier Patient Education  2021 Elsevier Inc.  

## 2021-01-13 NOTE — Telephone Encounter (Signed)
Pt called wanting to reschedule her procedure. Please call [phone]

## 2021-01-13 NOTE — Telephone Encounter (Signed)
LMOVM for pt 

## 2021-01-13 NOTE — Progress Notes (Signed)
Office Note 01/13/2021  CC:  Chief Complaint  Patient presents with  . Annual Exam    Pt is fasting    HPI:  Marissa Gibbs is a 70 y.o. White female who is here for annual health maintenance exam and 6 mo f/u HTN, HLD, and hypothyroidism. A/P as of last visit: "1) HTN: The current medical regimen is effective;  continue present plan and medications. Home avg good, no changes at this time. Lytes/cr today.  2) Hypothyroidism: taking meds correctly. TSH today.  3) HLD: tolerating statin. No changes today. FLP today."  INTERIM HX: 12/18/20 pt had BRBPR and went to ED.  Hb norma. GI f/u 12/24/20, plan was to set up colonoscopy. No further bleeding noted.  Onset 3d ago urine appearing darker and strong odor.  No dysuria, no urgency or frequency.   Not drinking fluids adequately, says not very thirsty anymore.      Past Medical History:  Diagnosis Date  . Abnormal mammogram of right breast 10/2015   Benign-appearing cysts on f/u diagnostic mammo and ultrasound: recommended stay on annual mammogram screening schedule  . Allergy   . Cervical cancer screening 12/2017   Pap normal-->no further pap screening indicated.  . Cervical polyp 12/2017   resected 01/2018 by Dr. Ailene Rud  . Colon cancer screening 01/10/2018   iFOB neg 01/2018.  iFOB neg 01/2019.  Cologuard NEG 08/2019.  Marland Kitchen Essential hypertension 2017  . GERD (gastroesophageal reflux disease)   . Hypercholesterolemia 01/2018   Excellent response to 20mg  atorva 2019  . Hypothyroidism, postsurgical     Past Surgical History:  Procedure Laterality Date  . CERVICAL POLYPECTOMY  01/2018  . COLONOSCOPY  06/2007   Dr. Sharlett Iles: descending colon and sigmoid colon diverticulosis. Internal hemorrhoids. Otherwise normal.   . DEXA  08/2006; 01/2018; 06/2020   2007; T -0.3.  2019 T score -0.5. 2021 T score -0.8  . ENDOMETRIAL BIOPSY  12/1999   Benign  . THYROIDECTOMY  07/02/15   Dr. Kelli Churn  . TUBAL LIGATION   03/03/89    Family History  Problem Relation Age of Onset  . Arthritis Mother   . Breast cancer Mother 32  . Hypertension Mother   . Diabetes Mother   . Heart attack Father   . Diabetes Father   . Alcohol abuse Father   . Thyroid disease Neg Hx   . Colon cancer Neg Hx   . Colon polyps Neg Hx     Social History   Socioeconomic History  . Marital status: Married    Spouse name: Not on file  . Number of children: Not on file  . Years of education: Not on file  . Highest education level: Not on file  Occupational History  . Not on file  Tobacco Use  . Smoking status: Never Smoker  . Smokeless tobacco: Never Used  Vaping Use  . Vaping Use: Never used  Substance and Sexual Activity  . Alcohol use: No  . Drug use: No  . Sexual activity: Not on file  Other Topics Concern  . Not on file  Social History Narrative   Married, one biologic daughter and one stepson.   Education: 12 grade.   Occupation: Woodworker Tax inspector).   No T/A/Ds.         Social Determinants of Health   Financial Resource Strain: Not on file  Food Insecurity: Not on file  Transportation Needs: Not on file  Physical Activity: Not on file  Stress: Not on file  Social Connections: Not on file  Intimate Partner Violence: Not on file    Outpatient Medications Prior to Visit  Medication Sig Dispense Refill  . atorvastatin (LIPITOR) 20 MG tablet TAKE 1 TABLET DAILY 90 tablet 1  . calcium-vitamin D (OSCAL WITH D) 250-125 MG-UNIT per tablet Take 1 tablet by mouth daily.    . cetirizine (ZYRTEC) 10 MG tablet Take 10 mg by mouth daily.    Marland Kitchen docusate sodium (COLACE) 100 MG capsule Take 1 capsule (100 mg total) by mouth 2 (two) times daily. 60 capsule 2  . hydrocortisone (ANUSOL-HC) 25 MG suppository Place 1 suppository (25 mg total) rectally every 12 (twelve) hours. 12 suppository 1  . levothyroxine (SYNTHROID) 112 MCG tablet TAKE 1 TABLET DAILY BEFORE BREAKFAST 90 tablet 1  . lisinopril (ZESTRIL)  30 MG tablet Take 1 tablet (30 mg total) by mouth daily. 90 tablet 1  . Multiple Vitamins-Minerals (MULTIVITAMIN PO) Take by mouth.    . nystatin cream (MYCOSTATIN) Apply topically 2 (two) times daily.    . Omega-3 Fatty Acids (FISH OIL PO) Take by mouth.    . pantoprazole (PROTONIX) 40 MG tablet Take 1 tablet (40 mg total) by mouth daily. 90 tablet 0  . triamcinolone cream (KENALOG) 0.1 % As needed    . albuterol (VENTOLIN HFA) 108 (90 Base) MCG/ACT inhaler Inhale 2 puffs into the lungs every 6 (six) hours as needed for wheezing or shortness of breath. (Patient not taking: Reported on 01/13/2021) 1 Inhaler 0   No facility-administered medications prior to visit.    No Known Allergies  ROS Review of Systems  Constitutional: Negative for appetite change, chills, fatigue and fever.  HENT: Negative for congestion, dental problem, ear pain and sore throat.   Eyes: Negative for discharge, redness and visual disturbance.  Respiratory: Negative for cough, chest tightness, shortness of breath and wheezing.   Cardiovascular: Negative for chest pain, palpitations and leg swelling.  Gastrointestinal: Negative for abdominal pain, blood in stool, diarrhea, nausea and vomiting.  Genitourinary: Negative for difficulty urinating, dysuria, flank pain, frequency, hematuria and urgency.  Musculoskeletal: Negative for arthralgias, back pain, joint swelling, myalgias and neck stiffness.  Skin: Negative for pallor and rash.  Neurological: Negative for dizziness, speech difficulty, weakness and headaches.  Hematological: Negative for adenopathy. Does not bruise/bleed easily.  Psychiatric/Behavioral: Negative for confusion and sleep disturbance. The patient is not nervous/anxious.     PE; Vitals with BMI 01/13/2021 12/24/2020 12/18/2020  Height 5\' 0"  5\' 0"  -  Weight 180 lbs 6 oz 181 lbs 3 oz -  BMI 81.19 14.78 -  Systolic 295 621 308  Diastolic 81 93 70  Pulse 84 98 -   Exam chaperoned by Deveron Furlong,  CMA.  Gen: Alert, well appearing.  Patient is oriented to person, place, time, and situation. AFFECT: pleasant, lucid thought and speech. ENT: Ears: EACs clear, normal epithelium.  TMs with good light reflex and landmarks bilaterally.  Eyes: no injection, icteris, swelling, or exudate.  EOMI, PERRLA. Nose: no drainage or turbinate edema/swelling.  No injection or focal lesion.  Mouth: lips without lesion/swelling.  Oral mucosa pink and moist.  Dentition intact and without obvious caries or gingival swelling.  Oropharynx without erythema, exudate, or swelling.  Neck: supple/nontender.  No LAD, mass, or TM.  Carotid pulses 2+ bilaterally, without bruits. CV: RRR, no m/r/g.   LUNGS: CTA bilat, nonlabored resps, good aeration in all lung fields. ABD: soft, NT, ND, BS normal.  No hepatospenomegaly or mass.  No  bruits. EXT: no clubbing, cyanosis, or edema.  Musculoskeletal: no joint swelling, erythema, warmth, or tenderness.  ROM of all joints intact. Skin - no sores or suspicious lesions or rashes or color changes   Pertinent labs:  Lab Results  Component Value Date   TSH 1.92 07/13/2020   Lab Results  Component Value Date   WBC 12.5 (H) 12/18/2020   HGB 13.5 12/18/2020   HCT 41.9 12/18/2020   MCV 94.2 12/18/2020   PLT 246 12/18/2020   Lab Results  Component Value Date   CREATININE 0.72 12/18/2020   BUN 11 12/18/2020   NA 137 12/18/2020   K 4.1 12/18/2020   CL 102 12/18/2020   CO2 27 12/18/2020   Lab Results  Component Value Date   ALT 17 12/18/2020   AST 17 12/18/2020   ALKPHOS 60 12/18/2020   BILITOT 0.3 12/18/2020   Lab Results  Component Value Date   CHOL 176 07/13/2020   Lab Results  Component Value Date   HDL 52 07/13/2020   Lab Results  Component Value Date   LDLCALC 100 (H) 07/13/2020   Lab Results  Component Value Date   TRIG 138 07/13/2020   Lab Results  Component Value Date   CHOLHDL 3.4 07/13/2020   Lab Results  Component Value Date   HGBA1C  5.8 10/02/2015   POC CC dipstick UA today: 2+ leuks, o/w normal.  ASSESSMENT AND PLAN:   1) HTN: stable.  Cont lisinopril 30mg  qd. Recent lytes/cr normal.  2) HLD: tolerating atorva 20mg  qd. FLP today. Recent hepatic panel normal.  3) Postsurg hypothyroidism: Hypoth: Takes T4 on empty stomach w/out any other meds. TSH today.  4) Urinary: NO classic UTI sx's, just color/odor change most c/w concentrated urine.  Mild abnl UA (2+ leuks). No meds at this time. Sent for c/s.  5) Health maintenance exam: Reviewed age and gender appropriate health maintenance issues (prudent diet, regular exercise, health risks of tobacco and excessive alcohol, use of seatbelts, fire alarms in home, use of sunscreen).  Also reviewed age and gender appropriate health screening as well as vaccine recommendations. Vaccines:  ALL UTD. Labs: flp, tsh  Cervical ca screening: pap was done by Dr. Stann Mainland 01/28/20. Breast ca screening: next screening mammogram due 06/2021. Colon ca screening: 2008 TCS no polyps but with diverticulosis and int hem.  2020 cologuard neg.  Recent BRBPR, Hb normal-->GI plans colonoscopy. Osteoporosis screening: DEXA normal bone density 06/2020, plan rpt after 06/2022.  An After Visit Summary was printed and given to the patient.  FOLLOW UP:  Return in about 6 months (around 07/13/2021) for routine chronic illness f/u.  Signed:  Crissie Sickles, MD           01/13/2021

## 2021-01-14 LAB — LIPID PANEL
Cholesterol: 139 mg/dL (ref <200)
HDL: 46 mg/dL — ABNORMAL LOW (ref >=50)
LDL Cholesterol (Calc): 76 mg/dL (calc)
Non-HDL Cholesterol (Calc): 93 mg/dL (calc) (ref <130)
Total CHOL/HDL Ratio: 3 (calc) (ref <5.0)
Triglycerides: 89 mg/dL (ref <150)

## 2021-01-14 LAB — URINE CULTURE
MICRO NUMBER:: 11514442
SPECIMEN QUALITY:: ADEQUATE

## 2021-01-14 LAB — TSH: TSH: 2.48 mIU/L (ref 0.40–4.50)

## 2021-01-14 NOTE — Telephone Encounter (Signed)
Called pt, no answer and VM full

## 2021-01-14 NOTE — Telephone Encounter (Signed)
Patient called back and wanted to r/s procedure. She has been placed back on schedule for 2/21 12:45pm. She also has been rescheduled for covid test as well. She still has her original instructions. I advised the only change is her arrival time now is 11:15am morning of procedure. She voiced understanding

## 2021-01-20 ENCOUNTER — Telehealth: Payer: Self-pay | Admitting: Internal Medicine

## 2021-01-20 NOTE — Telephone Encounter (Signed)
Called pt, questions answered. °

## 2021-01-20 NOTE — Telephone Encounter (Signed)
Pt has questions about the prep instructions. Please call [phone]

## 2021-01-22 ENCOUNTER — Other Ambulatory Visit (HOSPITAL_COMMUNITY)
Admission: RE | Admit: 2021-01-22 | Discharge: 2021-01-22 | Disposition: A | Discharge status: Home / Self Care | Payer: Medicare HMO | Source: Ambulatory Visit | Attending: Internal Medicine | Admitting: Internal Medicine

## 2021-01-22 ENCOUNTER — Other Ambulatory Visit: Payer: Self-pay

## 2021-01-22 ENCOUNTER — Other Ambulatory Visit (HOSPITAL_COMMUNITY): Payer: Medicare HMO

## 2021-01-22 ENCOUNTER — Encounter (HOSPITAL_COMMUNITY): Payer: Self-pay

## 2021-01-22 DIAGNOSIS — Z01812 Encounter for preprocedural laboratory examination: Secondary | ICD-10-CM | POA: Diagnosis not present

## 2021-01-22 DIAGNOSIS — Z20822 Contact with and (suspected) exposure to covid-19: Secondary | ICD-10-CM | POA: Insufficient documentation

## 2021-01-22 LAB — SARS CORONAVIRUS 2 (TAT 6-24 HRS): SARS Coronavirus 2: NEGATIVE

## 2021-01-25 ENCOUNTER — Ambulatory Visit (HOSPITAL_COMMUNITY): Payer: Medicare HMO | Admitting: Anesthesiology

## 2021-01-25 ENCOUNTER — Encounter (HOSPITAL_COMMUNITY): Payer: Self-pay

## 2021-01-25 ENCOUNTER — Encounter (HOSPITAL_COMMUNITY)
Admission: RE | Disposition: A | Discharge status: Home / Self Care | Payer: Self-pay | Source: Ambulatory Visit | Attending: Internal Medicine

## 2021-01-25 ENCOUNTER — Other Ambulatory Visit: Payer: Self-pay

## 2021-01-25 ENCOUNTER — Encounter (HOSPITAL_COMMUNITY): Admission: RE | Payer: Self-pay | Source: Ambulatory Visit

## 2021-01-25 ENCOUNTER — Ambulatory Visit (HOSPITAL_COMMUNITY): Admission: RE | Admit: 2021-01-25 | Payer: Medicare HMO | Source: Ambulatory Visit

## 2021-01-25 ENCOUNTER — Ambulatory Visit (HOSPITAL_COMMUNITY)
Admission: RE | Admit: 2021-01-25 | Discharge: 2021-01-25 | Disposition: A | Discharge status: Home / Self Care | Payer: Medicare HMO | Source: Ambulatory Visit | Attending: Internal Medicine | Admitting: Internal Medicine

## 2021-01-25 DIAGNOSIS — Z79899 Other long term (current) drug therapy: Secondary | ICD-10-CM | POA: Diagnosis not present

## 2021-01-25 DIAGNOSIS — Z79890 Hormone replacement therapy (postmenopausal): Secondary | ICD-10-CM | POA: Diagnosis not present

## 2021-01-25 DIAGNOSIS — K5730 Diverticulosis of large intestine without perforation or abscess without bleeding: Secondary | ICD-10-CM | POA: Diagnosis not present

## 2021-01-25 DIAGNOSIS — K648 Other hemorrhoids: Secondary | ICD-10-CM | POA: Insufficient documentation

## 2021-01-25 DIAGNOSIS — K625 Hemorrhage of anus and rectum: Secondary | ICD-10-CM

## 2021-01-25 DIAGNOSIS — E890 Postprocedural hypothyroidism: Secondary | ICD-10-CM | POA: Diagnosis not present

## 2021-01-25 HISTORY — PX: COLONOSCOPY WITH PROPOFOL: SHX5780

## 2021-01-25 HISTORY — DX: Other specified postprocedural states: Z98.890

## 2021-01-25 HISTORY — DX: Nausea with vomiting, unspecified: R11.2

## 2021-01-25 SURGERY — COLONOSCOPY WITH PROPOFOL
Anesthesia: Monitor Anesthesia Care

## 2021-01-25 SURGERY — COLONOSCOPY WITH PROPOFOL
Anesthesia: General

## 2021-01-25 MED ORDER — STERILE WATER FOR IRRIGATION IR SOLN
Status: DC | PRN
  Administered 2021-01-25: 1.5 mL

## 2021-01-25 MED ORDER — PROPOFOL 10 MG/ML IV BOLUS
INTRAVENOUS | Status: DC | PRN
  Administered 2021-01-25: 110 mg via INTRAVENOUS
  Administered 2021-01-25: 40 mg via INTRAVENOUS

## 2021-01-25 MED ORDER — LIDOCAINE HCL (CARDIAC) PF 100 MG/5ML IV SOSY
PREFILLED_SYRINGE | INTRAVENOUS | Status: DC | PRN
  Administered 2021-01-25: 50 mg via INTRAVENOUS

## 2021-01-25 MED ORDER — LACTATED RINGERS IV SOLN: INTRAVENOUS | Status: DC

## 2021-01-25 NOTE — Op Note (Signed)
[facility] Patient Name: Marissa Gibbs Procedure Date: 01/25/2021 11:43 AM MRN: [id number] Date of Birth: 1951/04/19 Attending MD: Elon Alas. Abbey Chatters DO CSN: [id number] Age: 70 Admit Type: Outpatient Procedure:                Colonoscopy Indications:              Rectal bleeding Providers:                Elon Alas. Abbey Chatters, DO, Ellerslie Page, [place]                            Risa Grill, Technician Referring MD:              Medicines:                See the Anesthesia note for documentation of the                            administered medications Complications:            No immediate complications. Estimated Blood Loss:     Estimated blood loss: none. Procedure:                Pre-Anesthesia Assessment:                           - The anesthesia plan was to use monitored                            anesthesia care (MAC).                           After obtaining informed consent, the colonoscope                            was passed under direct vision. Throughout the                            procedure, the patient's blood pressure, pulse, and                            oxygen saturations were monitored continuously. The                            [id number]-[id number] ([id number]) scope was introduced through                            the anus and advanced to the the terminal ileum,                            with identification of the appendiceal orifice and                            IC valve. The colonoscopy was performed without                            difficulty. The patient tolerated the procedure  well. The quality of the bowel preparation was                            evaluated using the BBPS [facility] Bowel Preparation                            Scale) with scores of: Right Colon = 3, Transverse                            Colon = 3 and Left Colon = 3 (entire mucosa seen                            well with no residual staining, small fragments  of                            stool or opaque liquid). The total BBPS score                            equals 9. Scope In: 11:56:55 AM Scope Out: 12:05:29 PM Scope Withdrawal Time: 0 hours 6 minutes 47 seconds  Total Procedure Duration: 0 hours 8 minutes 34 seconds  Findings:      The perianal and digital rectal examinations were normal.      Non-bleeding internal hemorrhoids were found during retroflexion.      Multiple small-mouthed diverticula were found in the sigmoid colon.      The exam was otherwise without abnormality. Impression:               - Non-bleeding internal hemorrhoids.                           - Diverticulosis in the sigmoid colon.                           - The examination was otherwise normal.                           - No specimens collected. Moderate Sedation:      Per Anesthesia Care Recommendation:           - Patient has a contact number available for                            emergencies. The signs and symptoms of potential                            delayed complications were discussed with the                            patient. Return to normal activities tomorrow.                            Written discharge instructions were provided to the                            patient.                           -  Resume previous diet.                           - Continue present medications.                           - Repeat colonoscopy in 10 years for screening                            purposes.                           - Return to GI clinic in 2 months with Marissa Gibbs                            for hemorrhoid banding. Procedure Code(s):        --- Professional ---                           [phone], Colonoscopy, flexible; diagnostic, including                            collection of specimen(s) by brushing or washing,                            when performed (separate procedure) Diagnosis Code(s):        --- Professional ---                            K64.8, Other hemorrhoids                           K62.5, Hemorrhage of anus and rectum                           K57.30, Diverticulosis of large intestine without                            perforation or abscess without bleeding CPT copyright 2019 [employer]. All rights reserved. The codes documented in this report are preliminary and upon coder review may  be revised to meet current compliance requirements. Elon Alas. Abbey Chatters, DO Oliver Abbey Chatters, DO 01/25/2021 12:16:17 PM This report has been signed electronically. Number of Addenda: 0

## 2021-01-25 NOTE — Anesthesia Postprocedure Evaluation (Signed)
Anesthesia Post Note  Patient: Marissa Gibbs  Procedure(s) Performed: COLONOSCOPY WITH PROPOFOL (N/A )  Patient location during evaluation: Phase II Anesthesia Type: General Level of consciousness: awake Pain management: pain level controlled Vital Signs Assessment: post-procedure vital signs reviewed and stable Respiratory status: spontaneous breathing Cardiovascular status: blood pressure returned to baseline Postop Assessment: no headache Anesthetic complications: no   No complications documented.   Last Vitals:  Vitals:   01/25/21 1209 01/25/21 1217  BP: (!) 82/40 (!) 93/48  Pulse: 75 71  Resp: 17 18  Temp: 36.4 C   SpO2: 98% 99%    Last Pain:  Vitals:   01/25/21 1217  TempSrc:   PainSc: 0-No pain                 Louann Sjogren

## 2021-01-25 NOTE — Discharge Instructions (Addendum)
Colonoscopy Discharge Instructions  Read the instructions outlined below and refer to this sheet in the next few weeks. These discharge instructions provide you with general information on caring for yourself after you leave the hospital. Your doctor may also give you specific instructions. While your treatment has been planned according to the most current medical practices available, unavoidable complications occasionally occur.   ACTIVITY  You may resume your regular activity, but move at a slower pace for the next 24 hours.   Take frequent rest periods for the next 24 hours.   Walking will help get rid of the air and reduce the bloated feeling in your belly (abdomen).   No driving for 24 hours (because of the medicine (anesthesia) used during the test).    Do not sign any important legal documents or operate any machinery for 24 hours (because of the anesthesia used during the test).  NUTRITION  Drink plenty of fluids.   You may resume your normal diet as instructed by your doctor.   Begin with a light meal and progress to your normal diet. Heavy or fried foods are harder to digest and may make you feel sick to your stomach (nauseated).   Avoid alcoholic beverages for 24 hours or as instructed.  MEDICATIONS  You may resume your normal medications unless your doctor tells you otherwise.  WHAT YOU CAN EXPECT TODAY  Some feelings of bloating in the abdomen.   Passage of more gas than usual.   Spotting of blood in your stool or on the toilet paper.  IF YOU HAD POLYPS REMOVED DURING THE COLONOSCOPY:  No aspirin products for 7 days or as instructed.   No alcohol for 7 days or as instructed.   Eat a soft diet for the next 24 hours.  FINDING OUT THE RESULTS OF YOUR TEST Not all test results are available during your visit. If your test results are not back during the visit, make an appointment with your caregiver to find out the results. Do not assume everything is normal if  you have not heard from your caregiver or the medical facility. It is important for you to follow up on all of your test results.  SEEK IMMEDIATE MEDICAL ATTENTION IF:  You have more than a spotting of blood in your stool.   Your belly is swollen (abdominal distention).   You are nauseated or vomiting.   You have a temperature over 101.   You have abdominal pain or discomfort that is severe or gets worse throughout the day.   Your colonoscopy was relatively unremarkable.  You do have internal hemorrhoids which is likely causing your bleeding.  I did not find any polyps or evidence of colon cancer.  I did not find any inflammation concerning for underlying inflammatory bowel disease such as Crohn's disease or ulcerative colitis.  I would recommend we repeat colonoscopy in 10 years for colon cancer screening purposes.  Follow-up with [name] in 2 to 3 months for hemorrhoid banding if still having issues.  I hope you have a great rest of your week!  Elon Alas. Abbey Chatters, D.O. Gastroenterology and Hepatology [facility]     Hemorrhoids Hemorrhoids are swollen veins in and around the rectum or anus. There are two types of hemorrhoids:  Internal hemorrhoids. These occur in the veins that are just inside the rectum. They may poke through to the outside and become irritated and painful.  External hemorrhoids. These occur in the veins that are outside the  anus and can be felt as a painful swelling or hard lump near the anus. Most hemorrhoids do not cause serious problems, and they can be managed with home treatments such as diet and lifestyle changes. If home treatments do not help the symptoms, procedures can be done to shrink or remove the hemorrhoids. What are the causes? This condition is caused by increased pressure in the anal area. This pressure may result from various things, including:  Constipation.  Straining to have a bowel  movement.  Diarrhea.  Pregnancy.  Obesity.  Sitting for long periods of time.  Heavy lifting or other activity that causes you to strain.  Anal sex.  Riding a bike for a long period of time. What are the signs or symptoms? Symptoms of this condition include:  Pain.  Anal itching or irritation.  Rectal bleeding.  Leakage of stool (feces).  Anal swelling.  One or more lumps around the anus. How is this diagnosed? This condition can often be diagnosed through a visual exam. Other exams or tests may also be done, such as:  An exam that involves feeling the rectal area with a gloved hand (digital rectal exam).  An exam of the anal canal that is done using a small tube (anoscope).  A blood test, if you have lost a significant amount of blood.  A test to look inside the colon using a flexible tube with a camera on the end (sigmoidoscopy or colonoscopy). How is this treated? This condition can usually be treated at home. However, various procedures may be done if dietary changes, lifestyle changes, and other home treatments do not help your symptoms. These procedures can help make the hemorrhoids smaller or remove them completely. Some of these procedures involve surgery, and others do not. Common procedures include:  Rubber band ligation. Rubber bands are placed at the base of the hemorrhoids to cut off their blood supply.  Sclerotherapy. Medicine is injected into the hemorrhoids to shrink them.  Infrared coagulation. A type of light energy is used to get rid of the hemorrhoids.  Hemorrhoidectomy surgery. The hemorrhoids are surgically removed, and the veins that supply them are tied off.  Stapled hemorrhoidopexy surgery. The surgeon staples the base of the hemorrhoid to the rectal wall. Follow these instructions at home: Eating and drinking  Eat foods that have a lot of fiber in them, such as whole grains, beans, nuts, fruits, and vegetables. Ask your health care  provider about taking products that have added fiber (fiber Monitored Anesthesia Care, Care After This sheet gives you information about how to care for yourself after your procedure. Your health care provider may also give you more specific instructions. If you have problems or questions, contact your health care provider. What can I expect after the procedure? After the procedure, it is common to have:  Tiredness.  Forgetfulness about what happened after the procedure.  Impaired judgment for important decisions.  Nausea or vomiting.  Some difficulty with balance. Follow these instructions at home: For the time period you were told by your health care provider:  Rest as needed.  Do not participate in activities where you could fall or become injured.  Do not drive or use machinery.  Do not drink alcohol.  Do not take sleeping pills or medicines that cause drowsiness.  Do not make important decisions or sign legal documents.  Do not take care of children on your own.      Eating and drinking  Follow the diet that  is recommended by your health care provider.  Drink enough fluid to keep your urine pale yellow.  If you vomit: ? Drink water, juice, or soup when you can drink without vomiting. ? Make sure you have little or no nausea before eating solid foods. General instructions  Have a responsible adult stay with you for the time you are told. It is important to have someone help care for you until you are awake and alert.  Take over-the-counter and prescription medicines only as told by your health care provider.  If you have sleep apnea, surgery and certain medicines can increase your risk for breathing problems. Follow instructions from your health care provider about wearing your sleep device: ? Anytime you are sleeping, including during daytime naps. ? While taking prescription pain medicines, sleeping medicines, or medicines that make you drowsy.  Avoid  smoking.  Keep all follow-up visits as told by your health care provider. This is important. Contact a health care provider if:  You keep feeling nauseous or you keep vomiting.  You feel light-headed.  You are still sleepy or having trouble with balance after 24 hours.  You develop a rash.  You have a fever.  You have redness or swelling around the IV site. Get help right away if:  You have trouble breathing.  You have new-onset confusion at home. Summary  For several hours after your procedure, you may feel tired. You may also be forgetful and have poor judgment.  Have a responsible adult stay with you for the time you are told. It is important to have someone help care for you until you are awake and alert.  Rest as told. Do not drive or operate machinery. Do not drink alcohol or take sleeping pills.  Get help right away if you have trouble breathing, or if you suddenly become confused. This information is not intended to replace advice given to you by your health care provider. Make sure you discuss any questions you have with your health care provider. Document Revised: 08/06/2020 Document Reviewed: 10/24/2019 Elsevier Patient Education  2021 [place].   supplements).  Reduce the amount of fat in your diet. You can do this by eating low-fat dairy products, eating less red meat, and avoiding processed foods.  Drink enough fluid to keep your urine pale yellow.   Managing pain and swelling  Take warm sitz baths for 20 minutes, 3-4 times a day to ease pain and discomfort. You may do this in a bathtub or using a portable sitz bath that fits over the toilet.  If directed, apply ice to the affected area. Using ice packs between sitz baths may be helpful. ? Put ice in a plastic bag. ? Place a towel between your skin and the bag. ? Leave the ice on for 20 minutes, 2-3 times a day.   General instructions  Take over-the-counter and prescription medicines only as told by  your health care provider.  Use medicated creams or suppositories as told.  Get regular exercise. Ask your health care provider how much and what kind of exercise is best for you. In general, you should do moderate exercise for at least 30 minutes on most days of the week (150 minutes each week). This can include activities such as walking, biking, or yoga.  Go to the bathroom when you have the urge to have a bowel movement. Do not wait.  Avoid straining to have bowel movements.  Keep the anal area dry and clean. Use wet  toilet paper or moist towelettes after a bowel movement.  Do not sit on the toilet for long periods of time. This increases blood pooling and pain.  Keep all follow-up visits as told by your health care provider. This is important. Contact a health care provider if you have:  Increasing pain and swelling that are not controlled by treatment or medicine.  Difficulty having a bowel movement, or you are unable to have a bowel movement.  Pain or inflammation outside the area of the hemorrhoids. Get help right away if you have:  Uncontrolled bleeding from your rectum. Summary  Hemorrhoids are swollen veins in and around the rectum or anus.  Most hemorrhoids can be managed with home treatments such as diet and lifestyle changes.  Taking warm sitz baths can help ease pain and discomfort.  In severe cases, procedures or surgery can be done to shrink or remove the hemorrhoids. This information is not intended to replace advice given to you by your health care provider. Make sure you discuss any questions you have with your health care provider. Document Revised: 04/19/2019 Document Reviewed: 04/12/2018 Elsevier Patient Education  2021 [place] After This sheet gives you information about how to  care for yourself after your procedure. Your health care provider may also give you more specific instructions. If you have problems or questions, contact your health care provider. What can I expect after the procedure? After the procedure, it is common to have:  Tiredness.  Forgetfulness about what happened after the procedure.  Impaired judgment for important decisions.  Nausea or vomiting.  Some difficulty with balance. Follow these instructions at home: For the time period you were told by your health care provider:  Rest as needed.  Do not participate in activities where you could fall or become injured.  Do not drive or use machinery.  Do not drink alcohol.  Do not take sleeping pills or medicines that cause drowsiness.  Do not make important decisions or sign legal documents.  Do not take care of children on your own.      Eating and drinking  Follow the diet that is recommended by your health care provider.  Drink enough fluid to keep your urine pale yellow.  If you vomit: ? Drink water, juice, or soup when you can drink without vomiting. ? Make sure you have little or no nausea before eating solid foods. General instructions  Have a responsible adult stay with you for the time you are told. It is important to have someone help care for you until you are awake and alert.  Take over-the-counter and prescription medicines only as told by your health care provider.  If you have sleep apnea, surgery and certain medicines can increase your risk for breathing problems. Follow instructions from your health care provider about wearing your sleep device: ? Anytime you are sleeping, including during daytime naps. ? While taking prescription pain medicines, sleeping medicines, or medicines that make you drowsy.  Avoid smoking.  Keep all follow-up visits as told by your health care provider. This is  important. Contact a health care provider if:  You keep feeling  nauseous or you keep vomiting.  You feel light-headed.  You are still sleepy or having trouble with balance after 24 hours.  You develop a rash.  You have a fever.  You have redness or swelling around the IV site. Get help right away if:  You have trouble breathing.  You have new-onset confusion at home. Summary  For several hours after your procedure, you may feel tired. You may also be forgetful and have poor judgment.  Have a responsible adult stay with you for the time you are told. It is important to have someone help care for you until you are awake and alert.  Rest as told. Do not drive or operate machinery. Do not drink alcohol or take sleeping pills.  Get help right away if you have trouble breathing, or if you suddenly become confused. This information is not intended to replace advice given to you by your health care provider. Make sure you discuss any questions you have with your health care provider. Document Revised: 08/06/2020 Document Reviewed: 10/24/2019 Elsevier Patient Education  2021 [employer].

## 2021-01-25 NOTE — H&P (Addendum)
Primary Care Physician:  Tammi Sou, MD Primary Gastroenterologist:  Dr. Abbey Chatters  Pre-Procedure History & Physical: HPI:  Marissa Gibbs is a 70 y.o. female is herefor a colonoscopy to be performed for rectal bleeding. Last CLN 2008 with diverticulosis and hemorrhoids.  Patient denies any family history of colorectal cancer.  No melena or hematochezia.  No abdominal pain or unintentional weight loss.  No change in bowel habits.  Overall feels well from a GI standpoint.  Past Medical History:  Diagnosis Date  . Abnormal mammogram of right breast 10/2015   Benign-appearing cysts on f/u diagnostic mammo and ultrasound: recommended stay on annual mammogram screening schedule  . Allergy   . Cervical cancer screening 12/2017   Pap normal-->no further pap screening indicated.  . Cervical polyp 12/2017   resected 01/2018 by Dr. Ailene Rud  . Colon cancer screening 01/10/2018   iFOB neg 01/2018.  iFOB neg 01/2019.  Cologuard NEG 08/2019.  Marland Kitchen Essential hypertension 2017  . GERD (gastroesophageal reflux disease)   . Hypercholesterolemia 01/2018   Excellent response to 20mg  atorva 2019  . Hypothyroidism, postsurgical   . PONV (postoperative nausea and vomiting)     Past Surgical History:  Procedure Laterality Date  . CERVICAL POLYPECTOMY  01/2018  . COLONOSCOPY  06/2007   Dr. Sharlett Iles: descending colon and sigmoid colon diverticulosis. Internal hemorrhoids. Otherwise normal.   . DEXA  08/2006; 01/2018; 06/2020   2007; T -0.3.  2019 T score -0.5. 2021 T score -0.8  . ENDOMETRIAL BIOPSY  12/1999   Benign  . THYROIDECTOMY  07/02/15   Dr. Kelli Churn  . TUBAL LIGATION  03/03/89    Prior to Admission medications   Medication Sig Start Date End Date Taking? Authorizing Provider  atorvastatin (LIPITOR) 20 MG tablet TAKE 1 TABLET DAILY Patient taking differently: Take 20 mg by mouth daily. 08/20/20  Yes McGowen, Adrian Blackwater, MD  calcium-vitamin D (OSCAL WITH D) 250-125 MG-UNIT per tablet Take 1  tablet by mouth daily.   Yes [provider]  cetirizine (ZYRTEC) 10 MG tablet Take 10 mg by mouth daily.   Yes [provider]  docusate sodium (COLACE) 100 MG capsule Take 1 capsule (100 mg total) by mouth 2 (two) times daily. Patient taking differently: Take 100 mg by mouth daily as needed for mild constipation. 12/18/20 12/18/21 Yes Fransico Meadow, PA-C  levothyroxine (SYNTHROID) 112 MCG tablet TAKE 1 TABLET DAILY BEFORE BREAKFAST Patient taking differently: Take 112 mcg by mouth daily before breakfast. 08/20/20  Yes McGowen, Adrian Blackwater, MD  lisinopril (ZESTRIL) 30 MG tablet Take 1 tablet (30 mg total) by mouth daily. 12/14/20  Yes McGowen, Adrian Blackwater, MD  Multiple Vitamins-Minerals (MULTIVITAMIN PO) Take 1 tablet by mouth daily at 2 am.   Yes [provider]  pantoprazole (PROTONIX) 40 MG tablet Take 1 tablet (40 mg total) by mouth daily. 12/16/20  Yes McGowen, Adrian Blackwater, MD  albuterol (VENTOLIN HFA) 108 (90 Base) MCG/ACT inhaler Inhale 2 puffs into the lungs every 6 (six) hours as needed for wheezing or shortness of breath. 12/15/17   McGowen, Adrian Blackwater, MD  hydrocortisone (ANUSOL-HC) 25 MG suppository Place 1 suppository (25 mg total) rectally every 12 (twelve) hours. Patient not taking: Reported on 01/18/2021 12/18/20 12/18/21  Fransico Meadow, PA-C    Allergies as of 01/14/2021  . (No Known Allergies)    Family History  Problem Relation Age of Onset  . Arthritis Mother   . Breast cancer Mother 78  . Hypertension  Mother   . Diabetes Mother   . Heart attack Father   . Diabetes Father   . Alcohol abuse Father   . Thyroid disease Neg Hx   . Colon cancer Neg Hx   . Colon polyps Neg Hx     Social History   Socioeconomic History  . Marital status: Married    Spouse name: Not on file  . Number of children: Not on file  . Years of education: Not on file  . Highest education level: Not on file  Occupational History  . Not on file  Tobacco Use  . Smoking status:  Never Smoker  . Smokeless tobacco: Never Used  Vaping Use  . Vaping Use: Never used  Substance and Sexual Activity  . Alcohol use: No  . Drug use: No  . Sexual activity: Not on file  Other Topics Concern  . Not on file  Social History Narrative   Married, one biologic daughter and one stepson.   Education: 12 grade.   Occupation: Woodworker Tax inspector).   No T/A/Ds.         Social Determinants of Health   Financial Resource Strain: Not on file  Food Insecurity: Not on file  Transportation Needs: Not on file  Physical Activity: Not on file  Stress: Not on file  Social Connections: Not on file  Intimate Partner Violence: Not on file    Review of Systems: See HPI, otherwise negative ROS  PE  General:   Alert,  Well-developed, well-nourished, pleasant and cooperative in NAD Head:  Normocephalic and atraumatic. Eyes:  Sclera clear, no icterus.   Conjunctiva pink. Ears:  Normal auditory acuity. Nose:  No deformity, discharge,  or lesions. Mouth:  No deformity or lesions, dentition normal. Neck:  Supple; no masses or thyromegaly. Lungs:  Clear throughout to auscultation.   No wheezes, crackles, or rhonchi. No acute distress. Heart:  Regular rate and rhythm; no murmurs, clicks, rubs,  or gallops. Abdomen:  Soft, nontender and nondistended. No masses, hepatosplenomegaly or hernias noted. Normal bowel sounds, without guarding, and without rebound.   Msk:  Symmetrical without gross deformities. Normal posture. Extremities:  Without clubbing or edema. Neurologic:  Alert and  oriented x4;  grossly normal neurologically. Skin:  Intact without significant lesions or rashes. Cervical Nodes:  No significant cervical adenopathy. Psych:  Alert and cooperative. Normal mood and affect.  Impression/Plan: Marissa Gibbs is here for a colonoscopy to be performed for rectal bleeding.   The risks of the procedure including infection, bleed, or perforation as well as benefits,  limitations, alternatives and imponderables have been reviewed with the patient. Questions have been answered. All parties agreeable.

## 2021-01-25 NOTE — Anesthesia Preprocedure Evaluation (Signed)
Anesthesia Evaluation  Patient identified by MRN, date of birth, ID band Patient awake    Reviewed: Allergy & Precautions, H&P , NPO status , Patient's Chart, lab work & pertinent test results, reviewed documented beta blocker date and time   History of Anesthesia Complications (+) PONV and history of anesthetic complications  Airway Mallampati: II  TM Distance: >3 FB Neck ROM: full    Dental no notable dental hx.    Pulmonary neg pulmonary ROS,    Pulmonary exam normal breath sounds clear to auscultation       Cardiovascular Exercise Tolerance: Good hypertension, negative cardio ROS   Rhythm:regular Rate:Normal     Neuro/Psych negative neurological ROS  negative psych ROS   GI/Hepatic Neg liver ROS, GERD  Medicated,  Endo/Other  Hypothyroidism   Renal/GU negative Renal ROS  negative genitourinary   Musculoskeletal   Abdominal   Peds  Hematology negative hematology ROS (+)   Anesthesia Other Findings   Reproductive/Obstetrics negative OB ROS                             Anesthesia Physical Anesthesia Plan  ASA: II  Anesthesia Plan: General   Post-op Pain Management:    Induction:   PONV Risk Score and Plan: Propofol infusion  Airway Management Planned:   Additional Equipment:   Intra-op Plan:   Post-operative Plan:   Informed Consent: I have reviewed the patients History and Physical, chart, labs and discussed the procedure including the risks, benefits and alternatives for the proposed anesthesia with the patient or authorized representative who has indicated his/her understanding and acceptance.     Dental Advisory Given  Plan Discussed with: CRNA  Anesthesia Plan Comments:         Anesthesia Quick Evaluation

## 2021-01-25 NOTE — Anesthesia Procedure Notes (Signed)
Date/Time: 01/25/2021 12:01 PM Performed by: Orlie Dakin, CRNA Pre-anesthesia Checklist: Patient identified, Emergency Drugs available, Suction available and Patient being monitored Patient Re-evaluated:Patient Re-evaluated prior to induction Oxygen Delivery Method: Nasal cannula Induction Type: IV induction Placement Confirmation: positive ETCO2

## 2021-01-25 NOTE — Transfer of Care (Signed)
Immediate Anesthesia Transfer of Care Note  Patient: Marissa Gibbs  Procedure(s) Performed: COLONOSCOPY WITH PROPOFOL (N/A )  Patient Location: Endoscopy Unit  Anesthesia Type:General  Level of Consciousness: awake, alert  and oriented  Airway & Oxygen Therapy: Patient Spontanous Breathing  Post-op Assessment: Report given to RN and Post -op Vital signs reviewed and stable  Post vital signs: Reviewed and stable  Last Vitals:  Vitals Value Taken Time  BP    Temp    Pulse    Resp    SpO2      Last Pain:  Vitals:   01/25/21 1152  TempSrc:   PainSc: 0-No pain      Patients Stated Pain Goal: 1 (83/41/96 2229)  Complications: No complications documented.

## 2021-01-28 ENCOUNTER — Encounter (HOSPITAL_COMMUNITY): Payer: Self-pay | Admitting: Internal Medicine

## 2021-03-07 ENCOUNTER — Other Ambulatory Visit: Payer: Self-pay | Admitting: Family Medicine

## 2021-03-08 ENCOUNTER — Telehealth: Payer: Self-pay | Admitting: Family Medicine

## 2021-03-08 NOTE — Telephone Encounter (Signed)
Immunization report updated. Nothing further needed.

## 2021-03-08 NOTE — Telephone Encounter (Signed)
Patient called to report she received her second shingles shot at [facility] 02/19/21.

## 2021-03-16 ENCOUNTER — Other Ambulatory Visit: Payer: Self-pay | Admitting: Family Medicine

## 2021-03-30 ENCOUNTER — Ambulatory Visit: Payer: Medicare HMO | Admitting: Gastroenterology

## 2021-03-30 ENCOUNTER — Encounter: Payer: Self-pay | Admitting: Gastroenterology

## 2021-03-30 ENCOUNTER — Other Ambulatory Visit: Payer: Self-pay

## 2021-03-30 VITALS — BP 172/86 | HR 87 | Temp 97.1°F | Ht 60.0 in | Wt 184.6 lb

## 2021-03-30 DIAGNOSIS — K648 Other hemorrhoids: Secondary | ICD-10-CM | POA: Diagnosis not present

## 2021-03-30 NOTE — Progress Notes (Signed)
[facility] Banding Note:   Pleasant 70 year old female presenting with history of rectal bleeding, recently undergoing colonoscopy with non-bleeding internal hemorrhoids, sigmoid diverticulosis. She is here today for banding.   The patient presents with symptomatic grade 1-2 hemorrhoids, unresponsive to maximal medical therapy, requesting rubber band ligation of her hemorrhoidal disease. All risks, benefits, and alternative forms of therapy were described and informed consent was obtained.   The decision was made to band the left lateral internal hemorrhoid, and the [place] was used to perform band ligation without complication. Digital anorectal examination was then performed to assure proper positioning of the band, and to adjust the banded tissue as required. The patient was discharged home without pain or other issues. Dietary and behavioral recommendations were given and (if necessary prescriptions were given), along with follow-up instructions. The patient will return in 2-3 weeksfor followup and possible additional banding as required.  No complications were encountered and the patient tolerated the procedure well.  Annitta Needs, PhD, ANP-BC [facility]

## 2021-03-30 NOTE — Patient Instructions (Signed)
We will see you in several weeks for repeat banding!  Avoid straining and limit toilet time to 2-3 minutes.  We will see you soon!  I enjoyed seeing you again today! As you know, I value our relationship and want to provide genuine, compassionate, and quality care. I welcome your feedback. If you receive a survey regarding your visit,  I greatly appreciate you taking time to fill this out. See you next time!  Annitta Needs, PhD, ANP-BC [facility]

## 2021-04-05 ENCOUNTER — Other Ambulatory Visit: Payer: Self-pay | Admitting: Family Medicine

## 2021-05-19 ENCOUNTER — Other Ambulatory Visit: Payer: Self-pay | Admitting: Family Medicine

## 2021-06-04 ENCOUNTER — Encounter: Payer: Self-pay | Admitting: *Deleted

## 2021-06-04 NOTE — Telephone Encounter (Signed)
This encounter was created in error - please disregard.

## 2021-06-11 ENCOUNTER — Other Ambulatory Visit: Payer: Self-pay | Admitting: *Deleted

## 2021-06-11 NOTE — Patient Outreach (Addendum)
[facility]) Care Management  06/11/2021  Marissa Gibbs February 04, 1951 [id number]  RN Health Coach received telephone call from patient.  [employer] compliance verified. RN discussed the services that [facility] provides. Per patient she declined the services that [facility] provides. RN made her aware that she would send educational material on her disease process every 6 months..  Plan: RN sent A Matter of Choice Blood Pressure Control Booklet RN will follow up and send out additional information in the month of January. RN sent a blood pressure monitor RN sent educational material on how to monitor BP   [place] [facility] [phone]

## 2021-06-23 ENCOUNTER — Other Ambulatory Visit (HOSPITAL_COMMUNITY): Payer: Self-pay | Admitting: Family Medicine

## 2021-06-23 DIAGNOSIS — Z1231 Encounter for screening mammogram for malignant neoplasm of breast: Secondary | ICD-10-CM

## 2021-06-24 ENCOUNTER — Other Ambulatory Visit: Payer: Self-pay

## 2021-06-24 ENCOUNTER — Ambulatory Visit (HOSPITAL_COMMUNITY)
Admission: RE | Admit: 2021-06-24 | Discharge: 2021-06-24 | Disposition: A | Discharge status: Home / Self Care | Payer: Medicare HMO | Source: Ambulatory Visit | Attending: Family Medicine | Admitting: Family Medicine

## 2021-06-24 DIAGNOSIS — Z1231 Encounter for screening mammogram for malignant neoplasm of breast: Secondary | ICD-10-CM | POA: Diagnosis not present

## 2021-06-29 ENCOUNTER — Ambulatory Visit: Payer: Medicare HMO | Admitting: Gastroenterology

## 2021-06-29 ENCOUNTER — Other Ambulatory Visit: Payer: Self-pay

## 2021-06-29 ENCOUNTER — Encounter: Payer: Self-pay | Admitting: Gastroenterology

## 2021-06-29 VITALS — BP 165/81 | HR 83 | Temp 97.5°F | Ht 60.0 in | Wt 187.4 lb

## 2021-06-29 DIAGNOSIS — K648 Other hemorrhoids: Secondary | ICD-10-CM | POA: Diagnosis not present

## 2021-06-29 NOTE — Progress Notes (Signed)
[facility] Banding Note:   Pleasant 70 year old female presenting with history of rectal bleeding, recently undergoing colonoscopy with non-bleeding internal hemorrhoids, sigmoid diverticulosis. Initial banding in April 2022 of left lateral hemorrhoid. She noted improvement since that time. Scant episode of bleeding recently. Mild itching.  The patient presents with symptomatic grade 1-2 hemorrhoids, unresponsive to maximal medical therapy, requesting rubber band ligation of her hemorrhoidal disease. All risks, benefits, and alternative forms of therapy were described and informed consent was obtained.  The decision was made to band the right posterior initially, but insufficient tissue was available. I then turned attention to the right anterior, with band ligation performed without complication.   Digital anorectal examination was then performed to assure proper positioning of the band, and to adjust the banded tissue as required. The patient was discharged home without pain or other issues. Dietary and behavioral recommendations were given and (if necessary prescriptions were given), along with follow-up instructions. The patient will return in as needed for additional banding. She had mild symptoms, so I suspect she will not need further banding but could definitely pursue if persistent symptoms.  Annitta Needs, PhD, ANP-BC [facility]

## 2021-06-29 NOTE — Patient Instructions (Signed)
We will see you back as needed! I believe we likely have banded all that can be banded; however, if you have recurrent symptoms, let me know.  Continue to avoid straining, limit toilet time, and avoid constipation!  I enjoyed seeing you again today! As you know, I value our relationship and want to provide genuine, compassionate, and quality care. I welcome your feedback. If you receive a survey regarding your visit,  I greatly appreciate you taking time to fill this out. See you next time!  Annitta Needs, PhD, ANP-BC [facility]

## 2021-06-30 DIAGNOSIS — K649 Unspecified hemorrhoids: Secondary | ICD-10-CM | POA: Diagnosis not present

## 2021-06-30 DIAGNOSIS — L309 Dermatitis, unspecified: Secondary | ICD-10-CM | POA: Diagnosis not present

## 2021-06-30 DIAGNOSIS — Z6836 Body mass index (BMI) 36.0-36.9, adult: Secondary | ICD-10-CM | POA: Diagnosis not present

## 2021-06-30 DIAGNOSIS — K08109 Complete loss of teeth, unspecified cause, unspecified class: Secondary | ICD-10-CM | POA: Diagnosis not present

## 2021-06-30 DIAGNOSIS — E785 Hyperlipidemia, unspecified: Secondary | ICD-10-CM | POA: Diagnosis not present

## 2021-06-30 DIAGNOSIS — I10 Essential (primary) hypertension: Secondary | ICD-10-CM | POA: Diagnosis not present

## 2021-06-30 DIAGNOSIS — K219 Gastro-esophageal reflux disease without esophagitis: Secondary | ICD-10-CM | POA: Diagnosis not present

## 2021-06-30 DIAGNOSIS — K5900 Constipation, unspecified: Secondary | ICD-10-CM | POA: Diagnosis not present

## 2021-06-30 DIAGNOSIS — E890 Postprocedural hypothyroidism: Secondary | ICD-10-CM | POA: Diagnosis not present

## 2021-07-19 ENCOUNTER — Ambulatory Visit (INDEPENDENT_AMBULATORY_CARE_PROVIDER_SITE_OTHER): Payer: Medicare HMO | Admitting: Family Medicine

## 2021-07-19 ENCOUNTER — Other Ambulatory Visit: Payer: Self-pay

## 2021-07-19 ENCOUNTER — Encounter: Payer: Self-pay | Admitting: Family Medicine

## 2021-07-19 VITALS — BP 135/74 | HR 73 | Temp 97.9°F | Resp 16 | Ht 65.0 in | Wt 186.8 lb

## 2021-07-19 DIAGNOSIS — I10 Essential (primary) hypertension: Secondary | ICD-10-CM

## 2021-07-19 DIAGNOSIS — E7800 Pure hypercholesterolemia, unspecified: Secondary | ICD-10-CM | POA: Diagnosis not present

## 2021-07-19 DIAGNOSIS — E890 Postprocedural hypothyroidism: Secondary | ICD-10-CM | POA: Diagnosis not present

## 2021-07-19 NOTE — Progress Notes (Signed)
OFFICE VISIT  07/19/2021  CC:  Chief Complaint  Patient presents with   Follow-up    RCI, pt is fasting    HPI:    Patient is a 70 y.o. Caucasian female who presents for 6 mo f/u HTN, HLD, and postsurg hypothyroidism. A/P as of last visit: "1) HTN: stable.  Cont lisinopril '30mg'$  qd. Recent lytes/cr normal.   2) HLD: tolerating atorva '20mg'$  qd. FLP today. Recent hepatic panel normal.   3) Postsurg hypothyroidism: Hypoth: Takes T4 on empty stomach w/out any other meds. TSH today.   4) Urinary: NO classic UTI sx's, just color/odor change most c/w concentrated urine.  Mild abnl UA (2+ leuks). No meds at this time. Sent for c/s.   5) Health maintenance exam: Reviewed age and gender appropriate health maintenance issues (prudent diet, regular exercise, health risks of tobacco and excessive alcohol, use of seatbelts, fire alarms in home, use of sunscreen).  Also reviewed age and gender appropriate health screening as well as vaccine recommendations. Vaccines:  ALL UTD. Labs: flp, tsh  Cervical ca screening: pap was done by Dr. Stann Mainland 01/28/20. Breast ca screening: next screening mammogram due 06/2021. Colon ca screening: 2008 TCS no polyps but with diverticulosis and int hem.  2020 cologuard neg.  Recent BRBPR, Hb normal-->GI plans colonoscopy. Osteoporosis screening: DEXA normal bone density 06/2020, plan rpt after 06/2022."  INTERIM HX: Doing well.  HTN: home monitoring 130/80 avg or better.  HLD: tolerating atorvastatin '20mg'$  qd.  Hypoth: Takes T4 on empty stomach w/out any other meds. Whole tab 4d/week, 1/2 tab 3d/week.  ROS as above, plus--> no fevers, no CP, no SOB, no wheezing, no cough, no dizziness, no HAs, no rashes, no melena/hematochezia.  No polyuria or polydipsia.  No myalgias or arthralgias.  No focal weakness, paresthesias, or tremors.  No acute vision or hearing abnormalities.  No dysuria or unusual/new urinary urgency or frequency.  No recent changes in lower  legs. No n/v/d or abd pain.  No palpitations.     Past Medical History:  Diagnosis Date   Abnormal mammogram of right breast 10/2015   Benign-appearing cysts on f/u diagnostic mammo and ultrasound: recommended stay on annual mammogram screening schedule   Allergy    Cervical cancer screening 12/2017   Pap normal-->no further pap screening indicated.   Cervical polyp 12/2017   resected 01/2018 by Dr. Ailene Rud   Colon cancer screening 01/10/2018   iFOB neg 01/2018.  iFOB neg 01/2019.  Cologuard NEG 08/2019. Colonoscopy w/out polyps 01/2021->recall 10 yrs.   Essential hypertension 2017   GERD (gastroesophageal reflux disease)    Hypercholesterolemia 01/2018   Excellent response to '20mg'$  atorva 2019   Hypothyroidism, postsurgical    Sigmoid diverticulosis     Past Surgical History:  Procedure Laterality Date   CERVICAL POLYPECTOMY  01/2018   COLONOSCOPY  06/2007   Dr. Sharlett Iles: descending colon and sigmoid colon diverticulosis. Internal hemorrhoids. Otherwise normal.    COLONOSCOPY WITH PROPOFOL N/A 01/25/2021   No polyps. +sigmoid diverticulosis and nonbleeding int hemorr. Procedure: COLONOSCOPY WITH PROPOFOL;  Surgeon: Eloise Harman, DO;  [facility];  Service: Endoscopy;  Laterality: N/A;  12:45pm   DEXA  08/2006; 01/2018; 06/2020   2007; T -0.3.  2019 T score -0.5. 2021 T score -0.8   ENDOMETRIAL BIOPSY  12/1999   Benign   THYROIDECTOMY  07/02/2015   Dr. Kelli Churn   TUBAL LIGATION  03/03/1989    Outpatient Medications Prior to Visit  Medication Sig Dispense Refill  atorvastatin (LIPITOR) 20 MG tablet TAKE 1 TABLET DAILY 90 tablet 1   calcium-vitamin D (OSCAL WITH D) 250-125 MG-UNIT per tablet Take 1 tablet by mouth daily.     cetirizine (ZYRTEC) 10 MG tablet Take 10 mg by mouth daily.     docusate sodium (COLACE) 100 MG capsule Take 1 capsule (100 mg total) by mouth 2 (two) times daily. (Patient taking differently: Take 100 mg by mouth daily as needed for  mild constipation.) 60 capsule 2   levothyroxine (SYNTHROID) 112 MCG tablet TAKE 1 TABLET DAILY BEFORE BREAKFAST 90 tablet 1   lisinopril (ZESTRIL) 30 MG tablet TAKE 1 TABLET DAILY 90 tablet 1   Multiple Vitamins-Minerals (MULTIVITAMIN PO) Take 1 tablet by mouth daily.     pantoprazole (PROTONIX) 40 MG tablet TAKE 1 TABLET DAILY 90 tablet 1   No facility-administered medications prior to visit.    No Known Allergies  ROS As per HPI  PE: Vitals with BMI 07/19/2021 06/29/2021 03/30/2021  Height '5\' 5"'$  '5\' 0"'$  '5\' 0"'$   Weight 186 lbs 13 oz 187 lbs 6 oz 184 lbs 10 oz  BMI 31.09 123XX123 123XX123  Systolic [id number]  Diastolic 74 81 86  Pulse 73 83 87   Gen: Alert, well appearing.  Patient is oriented to person, place, time, and situation. AFFECT: pleasant, lucid thought and speech. CV: RRR, no m/r/g.   LUNGS: CTA bilat, nonlabored resps, good aeration in all lung fields. EXT: no clubbing or cyanosis.  no edema.    LABS:  Lab Results  Component Value Date   TSH 2.48 01/13/2021   Lab Results  Component Value Date   WBC 12.5 (H) 12/18/2020   HGB 13.5 12/18/2020   HCT 41.9 12/18/2020   MCV 94.2 12/18/2020   PLT 246 12/18/2020   Lab Results  Component Value Date   CREATININE 0.72 12/18/2020   BUN 11 12/18/2020   NA 137 12/18/2020   K 4.1 12/18/2020   CL 102 12/18/2020   CO2 27 12/18/2020   Lab Results  Component Value Date   ALT 17 12/18/2020   AST 17 12/18/2020   ALKPHOS 60 12/18/2020   BILITOT 0.3 12/18/2020   Lab Results  Component Value Date   CHOL 139 01/13/2021   Lab Results  Component Value Date   HDL 46 (L) 01/13/2021   Lab Results  Component Value Date   LDLCALC 76 01/13/2021   Lab Results  Component Value Date   TRIG 89 01/13/2021   Lab Results  Component Value Date   CHOLHDL 3.0 01/13/2021   Lab Results  Component Value Date   HGBA1C 5.8 10/02/2015   IMPRESSION AND PLAN:  1) HTN: good control on lisinopril 30 qd. Lytes/cr today.  2) HLD:  tolerating atorva 20 qd. FLP and hepatic panel today.  3) Postsurg hypothyroidism: TSH today.  An After Visit Summary was printed and given to the patient.  FOLLOW UP: Return in about 6 months (around 01/19/2022) for annual CPE (fasting).  Signed:  Crissie Sickles, MD           07/19/2021

## 2021-07-20 LAB — COMPREHENSIVE METABOLIC PANEL
AG Ratio: 1.6 (calc) (ref 1.0–2.5)
ALT: 11 U/L (ref 6–29)
AST: 13 U/L (ref 10–35)
Albumin: 4.3 g/dL (ref 3.6–5.1)
Alkaline phosphatase (APISO): 55 U/L (ref 37–153)
BUN: 14 mg/dL (ref 7–25)
CO2: 20 mmol/L (ref 20–32)
Calcium: 9.8 mg/dL (ref 8.6–10.4)
Chloride: 102 mmol/L (ref 98–110)
Creat: 0.76 mg/dL (ref 0.50–1.05)
Globulin: 2.7 g/dL (calc) (ref 1.9–3.7)
Glucose, Bld: 98 mg/dL (ref 65–99)
Potassium: 4.4 mmol/L (ref 3.5–5.3)
Sodium: 140 mmol/L (ref 135–146)
Total Bilirubin: 0.4 mg/dL (ref 0.2–1.2)
Total Protein: 7 g/dL (ref 6.1–8.1)

## 2021-07-20 LAB — LIPID PANEL
Cholesterol: 147 mg/dL (ref <200)
HDL: 57 mg/dL (ref >=50)
LDL Cholesterol (Calc): 76 mg/dL (calc)
Non-HDL Cholesterol (Calc): 90 mg/dL (calc) (ref <130)
Total CHOL/HDL Ratio: 2.6 (calc) (ref <5.0)
Triglycerides: 65 mg/dL (ref <150)

## 2021-07-20 LAB — TSH: TSH: 3.16 mIU/L (ref 0.40–4.50)

## 2021-08-12 DIAGNOSIS — H5200 Hypermetropia, unspecified eye: Secondary | ICD-10-CM | POA: Diagnosis not present

## 2021-08-12 DIAGNOSIS — H35363 Drusen (degenerative) of macula, bilateral: Secondary | ICD-10-CM | POA: Diagnosis not present

## 2021-08-12 DIAGNOSIS — Z0100 Encounter for examination of eyes and vision without abnormal findings: Secondary | ICD-10-CM | POA: Diagnosis not present

## 2021-08-12 DIAGNOSIS — E7800 Pure hypercholesterolemia, unspecified: Secondary | ICD-10-CM | POA: Diagnosis not present

## 2021-08-31 ENCOUNTER — Other Ambulatory Visit: Payer: Self-pay | Admitting: Family Medicine

## 2021-12-05 ENCOUNTER — Other Ambulatory Visit: Payer: Self-pay | Admitting: Family Medicine

## 2021-12-13 ENCOUNTER — Other Ambulatory Visit: Payer: Self-pay | Admitting: *Deleted

## 2021-12-13 NOTE — Patient Outreach (Signed)
[facility]) Care Management  12/13/2021  RAIMA GEATHERS August 14, 1951 [id number]   [facility] sending six month educational mailing on hypertension. Patient had agreed to mailing as discussed.  Plan: RN sent High and low sodium sheet.  Managing your hypertension Next 6 month mailing is July  Johny Shock BSN RN [place] Management [phone]

## 2022-01-21 ENCOUNTER — Other Ambulatory Visit: Payer: Self-pay

## 2022-01-21 ENCOUNTER — Ambulatory Visit (INDEPENDENT_AMBULATORY_CARE_PROVIDER_SITE_OTHER): Payer: Medicare HMO | Admitting: Family Medicine

## 2022-01-21 ENCOUNTER — Encounter: Payer: Self-pay | Admitting: Family Medicine

## 2022-01-21 VITALS — BP 130/78 | HR 73 | Temp 98.4°F | Ht 65.0 in | Wt 187.6 lb

## 2022-01-21 DIAGNOSIS — Z23 Encounter for immunization: Secondary | ICD-10-CM | POA: Diagnosis not present

## 2022-01-21 DIAGNOSIS — Z0000 Encounter for general adult medical examination without abnormal findings: Secondary | ICD-10-CM

## 2022-01-21 DIAGNOSIS — L309 Dermatitis, unspecified: Secondary | ICD-10-CM

## 2022-01-21 DIAGNOSIS — E785 Hyperlipidemia, unspecified: Secondary | ICD-10-CM

## 2022-01-21 DIAGNOSIS — I10 Essential (primary) hypertension: Secondary | ICD-10-CM | POA: Diagnosis not present

## 2022-01-21 DIAGNOSIS — E039 Hypothyroidism, unspecified: Secondary | ICD-10-CM | POA: Diagnosis not present

## 2022-01-21 MED ORDER — ATORVASTATIN CALCIUM 20 MG PO TABS: 20.0000 mg | ORAL_TABLET | Freq: Every day | ORAL | 3 refills | Status: DC

## 2022-01-21 MED ORDER — PANTOPRAZOLE SODIUM 40 MG PO TBEC: 40.0000 mg | DELAYED_RELEASE_TABLET | Freq: Every day | ORAL | 3 refills | Status: DC

## 2022-01-21 MED ORDER — LEVOTHYROXINE SODIUM 112 MCG PO TABS
112.0000 ug | ORAL_TABLET | Freq: Every day | ORAL | 3 refills | Status: DC
Start: 2022-01-21 — End: 2023-01-16

## 2022-01-21 MED ORDER — LISINOPRIL 30 MG PO TABS: 30.0000 mg | ORAL_TABLET | Freq: Every day | ORAL | 3 refills | Status: DC

## 2022-01-21 MED ORDER — NEOMYCIN-POLYMYXIN-HC 1 % OT SOLN
OTIC | 1 refills | Status: DC
Start: 2022-01-21 — End: 2024-03-07

## 2022-01-21 NOTE — Addendum Note (Signed)
Addended by: Deveron Furlong D on: 01/21/2022 08:45 AM   Modules accepted: Orders

## 2022-01-21 NOTE — Progress Notes (Signed)
Office Note 01/21/2022  CC:  Chief Complaint  Patient presents with   Annual Exam    Pt is fasting    HPI:  Patient is a 71 y.o. female who is here for annual health maintenance exam and 6 mo f/u HTN, HLD, and hypothyroidism. Marissa Gibbs is feeling well.  Just has some itching in both ear canals.  Some sneezing but no nasal congestion or sinus pain.  Home bp monitoring: 130/80 or better at home  Past Medical History:  Diagnosis Date   Abnormal mammogram of right breast 10/2015   Benign-appearing cysts on f/u diagnostic mammo and ultrasound: recommended stay on annual mammogram screening schedule   Allergy    Cervical cancer screening 12/2017   Pap normal-->no further pap screening indicated.   Cervical polyp 12/2017   resected 01/2018 by Dr. Ailene Rud   Colon cancer screening 01/10/2018   iFOB neg 01/2018.  iFOB neg 01/2019.  Cologuard NEG 08/2019. Colonoscopy w/out polyps 01/2021->recall 10 yrs.   Essential hypertension 2017   GERD (gastroesophageal reflux disease)    Hypercholesterolemia 01/2018   Excellent response to 20mg  atorva 2019   Hypothyroidism, postsurgical    Sigmoid diverticulosis     Past Surgical History:  Procedure Laterality Date   CERVICAL POLYPECTOMY  01/2018   COLONOSCOPY  06/2007   Dr. Sharlett Iles: descending colon and sigmoid colon diverticulosis. Internal hemorrhoids. Otherwise normal.    COLONOSCOPY WITH PROPOFOL N/A 01/25/2021   No polyps. +sigmoid diverticulosis and nonbleeding int hemorr. Procedure: COLONOSCOPY WITH PROPOFOL;  Surgeon: Eloise Harman, DO;  [facility];  Service: Endoscopy;  Laterality: N/A;  12:45pm   DEXA  08/2006; 01/2018; 06/2020   2007; T -0.3.  2019 T score -0.5. 2021 T score -0.8   ENDOMETRIAL BIOPSY  12/1999   Benign   THYROIDECTOMY  07/02/2015   Dr. Kelli Churn   TUBAL LIGATION  03/03/1989    Family History  Problem Relation Age of Onset   Arthritis Mother    Breast cancer Mother 42   Hypertension Mother     Diabetes Mother    Heart attack Father    Diabetes Father    Alcohol abuse Father    Thyroid disease Neg Hx    Colon cancer Neg Hx    Colon polyps Neg Hx     Social History   Socioeconomic History   Marital status: Married    Spouse name: Not on file   Number of children: Not on file   Years of education: Not on file   Highest education level: Not on file  Occupational History   Not on file  Tobacco Use   Smoking status: Never   Smokeless tobacco: Never  Vaping Use   Vaping Use: Never used  Substance and Sexual Activity   Alcohol use: No   Drug use: No   Sexual activity: Not on file  Other Topics Concern   Not on file  Social History Narrative   Married, one biologic daughter and one stepson.   Education: 12 grade.   Occupation: Woodworker Tax inspector).   No T/A/Ds.         Social Determinants of Health   Financial Resource Strain: Not on file  Food Insecurity: Not on file  Transportation Needs: Not on file  Physical Activity: Not on file  Stress: Not on file  Social Connections: Not on file  Intimate Partner Violence: Not on file    Outpatient Medications Prior to Visit  Medication Sig Dispense Refill  calcium-vitamin D (OSCAL WITH D) 250-125 MG-UNIT per tablet Take 1 tablet by mouth daily.     cetirizine (ZYRTEC) 10 MG tablet Take 10 mg by mouth daily.     Multiple Vitamins-Minerals (MULTIVITAMIN PO) Take 1 tablet by mouth daily.     atorvastatin (LIPITOR) 20 MG tablet TAKE 1 TABLET DAILY 90 tablet 1   levothyroxine (SYNTHROID) 112 MCG tablet TAKE 1 TABLET DAILY BEFORE BREAKFAST 90 tablet 1   lisinopril (ZESTRIL) 30 MG tablet TAKE 1 TABLET DAILY 90 tablet 1   pantoprazole (PROTONIX) 40 MG tablet TAKE 1 TABLET DAILY 90 tablet 1   No facility-administered medications prior to visit.    No Known Allergies  ROS Review of Systems  Constitutional:  Negative for appetite change, chills, fatigue and fever.  HENT:  Negative for congestion, dental  problem, ear pain and sore throat.   Eyes:  Negative for discharge, redness and visual disturbance.  Respiratory:  Negative for cough, chest tightness, shortness of breath and wheezing.   Cardiovascular:  Negative for chest pain, palpitations and leg swelling.  Gastrointestinal:  Negative for abdominal pain, blood in stool, diarrhea, nausea and vomiting.  Genitourinary:  Negative for difficulty urinating, dysuria, flank pain, frequency, hematuria and urgency.  Musculoskeletal:  Negative for arthralgias, back pain, joint swelling, myalgias and neck stiffness.  Skin:  Negative for pallor and rash.  Neurological:  Negative for dizziness, speech difficulty, weakness and headaches.  Hematological:  Negative for adenopathy. Does not bruise/bleed easily.  Psychiatric/Behavioral:  Negative for confusion and sleep disturbance. The patient is not nervous/anxious.    PE; Vitals with BMI 01/21/2022 07/19/2021 06/29/2021  Height 5\' 5"  5\' 5"  5\' 0"   Weight 187 lbs 10 oz 186 lbs 13 oz 187 lbs 6 oz  BMI 31.22 23.30 07.6  Systolic 226 333 545  Diastolic 82 74 81  Pulse 73 73 83    Exam chaperoned by Deveron Furlong, CMA. Gen: Alert, well appearing.  Patient is oriented to person, place, time, and situation. AFFECT: pleasant, lucid thought and speech. ENT: Ears: EACs without significant cerumen.  There is some mild flaky/hyperkeratotic debris scattered over the external canal skin.  TMs with good light reflex and landmarks bilaterally.  Eyes: no injection, icteris, swelling, or exudate.  EOMI, PERRLA. Nose: no drainage or turbinate edema/swelling.  No injection or focal lesion.  Mouth: lips without lesion/swelling.  Oral mucosa pink and moist.  Dentition intact and without obvious caries or gingival swelling.  Oropharynx without erythema, exudate, or swelling.  Neck: supple/nontender.  No LAD, mass, or TM.  Carotid pulses 2+ bilaterally, without bruits. CV: RRR, no m/r/g.   LUNGS: CTA bilat, nonlabored  resps, good aeration in all lung fields. ABD: soft, NT, ND, BS normal.  No hepatospenomegaly or mass.  No bruits. EXT: no clubbing, cyanosis, or edema.  Musculoskeletal: no joint swelling, erythema, warmth, or tenderness.  ROM of all joints intact. Skin - no sores or suspicious lesions or rashes or color changes  Pertinent labs:  Lab Results  Component Value Date   TSH 3.16 07/19/2021   Lab Results  Component Value Date   WBC 12.5 (H) 12/18/2020   HGB 13.5 12/18/2020   HCT 41.9 12/18/2020   MCV 94.2 12/18/2020   PLT 246 12/18/2020   Lab Results  Component Value Date   CREATININE 0.76 07/19/2021   BUN 14 07/19/2021   NA 140 07/19/2021   K 4.4 07/19/2021   CL 102 07/19/2021   CO2 20 07/19/2021   Lab  Results  Component Value Date   ALT 11 07/19/2021   AST 13 07/19/2021   ALKPHOS 60 12/18/2020   BILITOT 0.4 07/19/2021   Lab Results  Component Value Date   CHOL 147 07/19/2021   Lab Results  Component Value Date   HDL 57 07/19/2021   Lab Results  Component Value Date   LDLCALC 76 07/19/2021   Lab Results  Component Value Date   TRIG 65 07/19/2021   Lab Results  Component Value Date   CHOLHDL 2.6 07/19/2021   Lab Results  Component Value Date   HGBA1C 5.8 10/02/2015   ASSESSMENT AND PLAN:   #1 hypertension.  Well-controlled.  Continue lisinopril 30 mg a day.  2.  Hyperlipidemia.  Tolerating atorvastatin 20 mg a day well. Lipid panel and hepatic panel today.  3.  Hypothyroidism. Continue 112 mcg tab once a day. TSH today.  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 This has responded to Cortisporin otic for her in the past.  Prescribed today.  5. Health maintenance exam: Reviewed age and gender appropriate health maintenance issues (prudent diet, regular exercise, health risks of tobacco and excessive alcohol, use of seatbelts, fire alarms in home, use of sunscreen).  Also reviewed age and gender appropriate health screening as well as vaccine  recommendations. Vaccines: flu->given.  Otherwise UTD Labs: HP labs ordered Cervical ca screening: per GYN, Dr. Stann Mainland Breast ca screening: next mammo scheduled for March this year Colon ca screening: consider recall 2032 Osteoporosis screening: DEXA normal bone density 06/2020, plan rpt after 06/2022.  An After Visit Summary was printed and given to the patient.  FOLLOW UP:  Return in about 6 months (around 07/21/2022).  Signed:  Crissie Sickles, MD           01/21/2022

## 2022-01-21 NOTE — Patient Instructions (Signed)

## 2022-01-22 LAB — LIPID PANEL
Cholesterol: 146 mg/dL (ref <200)
HDL: 49 mg/dL — ABNORMAL LOW (ref >=50)
LDL Cholesterol (Calc): 79 mg/dL (calc)
Non-HDL Cholesterol (Calc): 97 mg/dL (calc) (ref <130)
Total CHOL/HDL Ratio: 3 (calc) (ref <5.0)
Triglycerides: 99 mg/dL (ref <150)

## 2022-01-22 LAB — COMPREHENSIVE METABOLIC PANEL
AG Ratio: 1.4 (calc) (ref 1.0–2.5)
ALT: 13 U/L (ref 6–29)
AST: 13 U/L (ref 10–35)
Albumin: 3.9 g/dL (ref 3.6–5.1)
Alkaline phosphatase (APISO): 56 U/L (ref 37–153)
BUN: 10 mg/dL (ref 7–25)
CO2: 26 mmol/L (ref 20–32)
Calcium: 9.5 mg/dL (ref 8.6–10.4)
Chloride: 104 mmol/L (ref 98–110)
Creat: 0.76 mg/dL (ref 0.50–1.05)
Globulin: 2.7 g/dL (calc) (ref 1.9–3.7)
Glucose, Bld: 93 mg/dL (ref 65–99)
Potassium: 4.2 mmol/L (ref 3.5–5.3)
Sodium: 140 mmol/L (ref 135–146)
Total Bilirubin: 0.4 mg/dL (ref 0.2–1.2)
Total Protein: 6.6 g/dL (ref 6.1–8.1)

## 2022-01-22 LAB — CBC WITH DIFFERENTIAL/PLATELET
Absolute Monocytes: 360 cells/uL (ref 200–950)
Basophils Absolute: 50 cells/uL (ref 0–200)
Basophils Relative: 0.8 %
Eosinophils Absolute: 149 cells/uL (ref 15–500)
Eosinophils Relative: 2.4 %
HCT: 38.7 % (ref 35.0–45.0)
Hemoglobin: 12.7 g/dL (ref 11.7–15.5)
Lymphs Abs: 1817 cells/uL (ref 850–3900)
MCH: 29.7 pg (ref 27.0–33.0)
MCHC: 32.8 g/dL (ref 32.0–36.0)
MCV: 90.4 fL (ref 80.0–100.0)
MPV: 10.8 fL (ref 7.5–12.5)
Monocytes Relative: 5.8 %
Neutro Abs: 3825 cells/uL (ref 1500–7800)
Neutrophils Relative %: 61.7 %
Platelets: 226 10*3/uL (ref 140–400)
RBC: 4.28 10*6/uL (ref 3.80–5.10)
RDW: 12.4 % (ref 11.0–15.0)
Total Lymphocyte: 29.3 %
WBC: 6.2 10*3/uL (ref 3.8–10.8)

## 2022-01-22 LAB — TSH: TSH: 2.86 mIU/L (ref 0.40–4.50)

## 2022-01-31 DIAGNOSIS — Z124 Encounter for screening for malignant neoplasm of cervix: Secondary | ICD-10-CM | POA: Diagnosis not present

## 2022-01-31 DIAGNOSIS — Z6836 Body mass index (BMI) 36.0-36.9, adult: Secondary | ICD-10-CM | POA: Diagnosis not present

## 2022-01-31 DIAGNOSIS — Z01419 Encounter for gynecological examination (general) (routine) without abnormal findings: Secondary | ICD-10-CM | POA: Diagnosis not present

## 2022-02-03 LAB — HM PAP SMEAR: HM Pap smear: NORMAL

## 2022-02-09 ENCOUNTER — Telehealth: Payer: Self-pay

## 2022-02-09 NOTE — Telephone Encounter (Signed)
Pt would like to do AWV every other year. Declines to do this year. ?

## 2022-03-02 DIAGNOSIS — M25561 Pain in right knee: Secondary | ICD-10-CM | POA: Diagnosis not present

## 2022-03-17 DIAGNOSIS — M25561 Pain in right knee: Secondary | ICD-10-CM | POA: Diagnosis not present

## 2022-04-14 DIAGNOSIS — L820 Inflamed seborrheic keratosis: Secondary | ICD-10-CM | POA: Diagnosis not present

## 2022-05-18 ENCOUNTER — Other Ambulatory Visit: Payer: Self-pay | Admitting: *Deleted

## 2022-05-19 NOTE — Patient Outreach (Signed)
[place]) Care Management  05/19/2022  CHINARA HERTZBERG 23-Aug-1951 [id number]   [place] has discontinued the educational mailing program.       Plan: Six month educational mailing closed RN sent patient a letter  [place] Management [phone]

## 2022-05-24 ENCOUNTER — Other Ambulatory Visit (HOSPITAL_COMMUNITY): Payer: Self-pay | Admitting: Family Medicine

## 2022-05-24 DIAGNOSIS — Z1231 Encounter for screening mammogram for malignant neoplasm of breast: Secondary | ICD-10-CM

## 2022-06-14 ENCOUNTER — Ambulatory Visit: Payer: Medicare HMO | Admitting: *Deleted

## 2022-07-07 IMAGING — MG DIGITAL SCREENING BILAT W/ CAD
5 series · 5 of 5 positions shown · non-contrast
Comparison: Previous exam(s).

ACR Breast Density Category a: The breast tissue is almost entirely
fatty.

CLINICAL DATA: Screening.

EXAM:
DIGITAL SCREENING BILATERAL MAMMOGRAM WITH CAD

[R CC (1 of 2)]
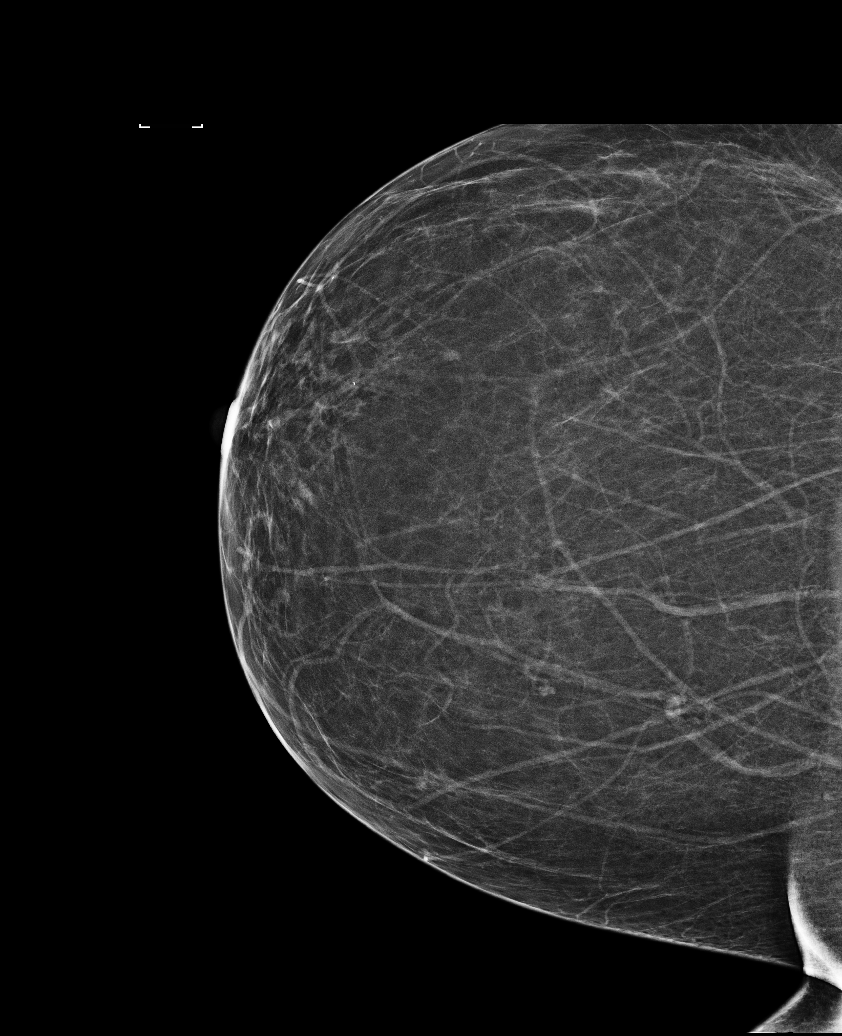

[R MLO]
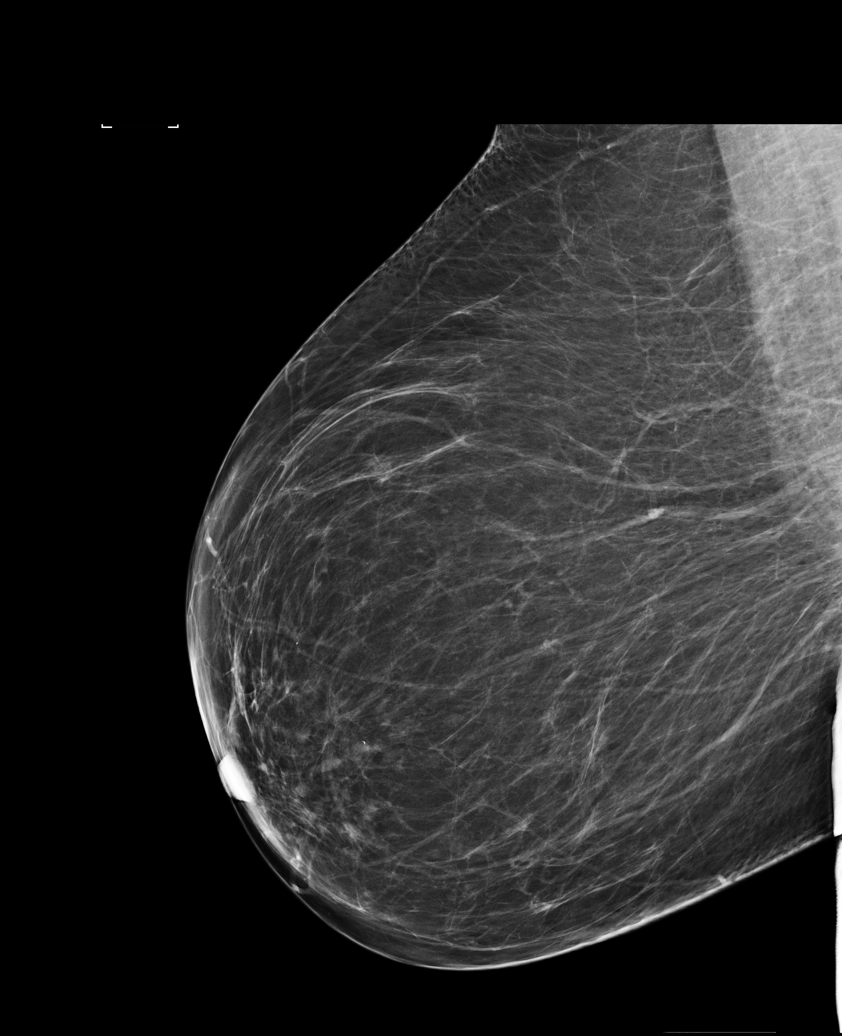

[R CC (2 of 2)]
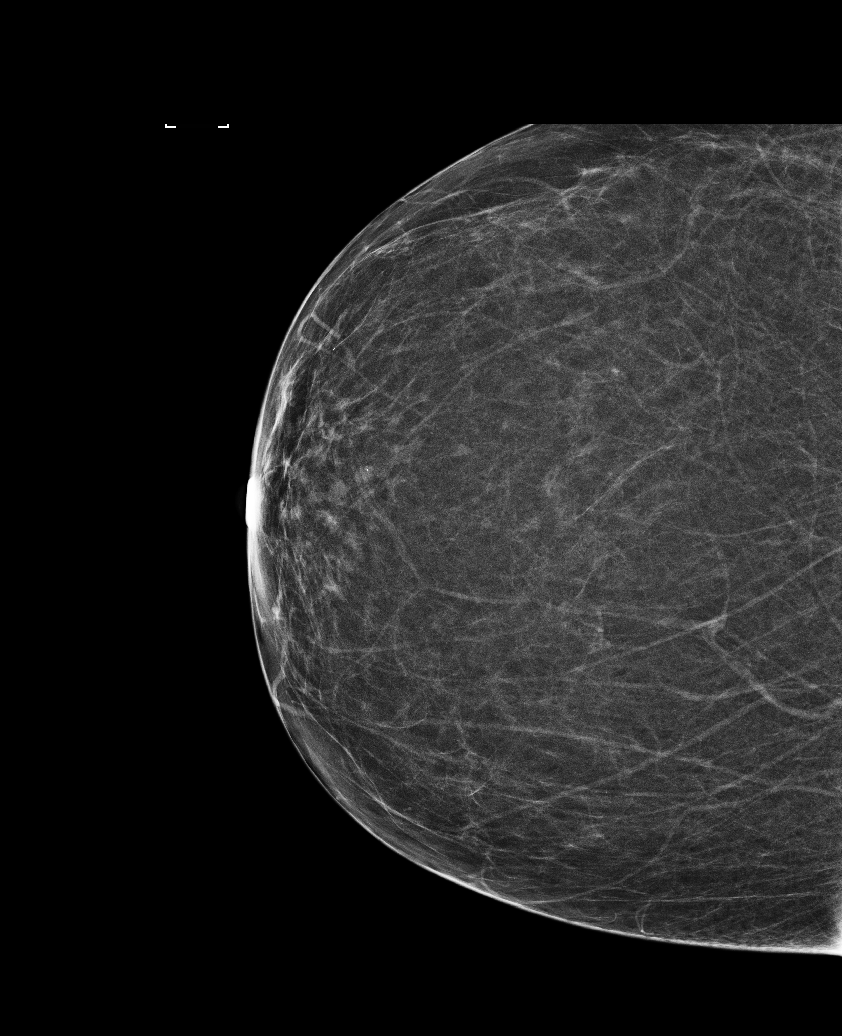

[L CC]
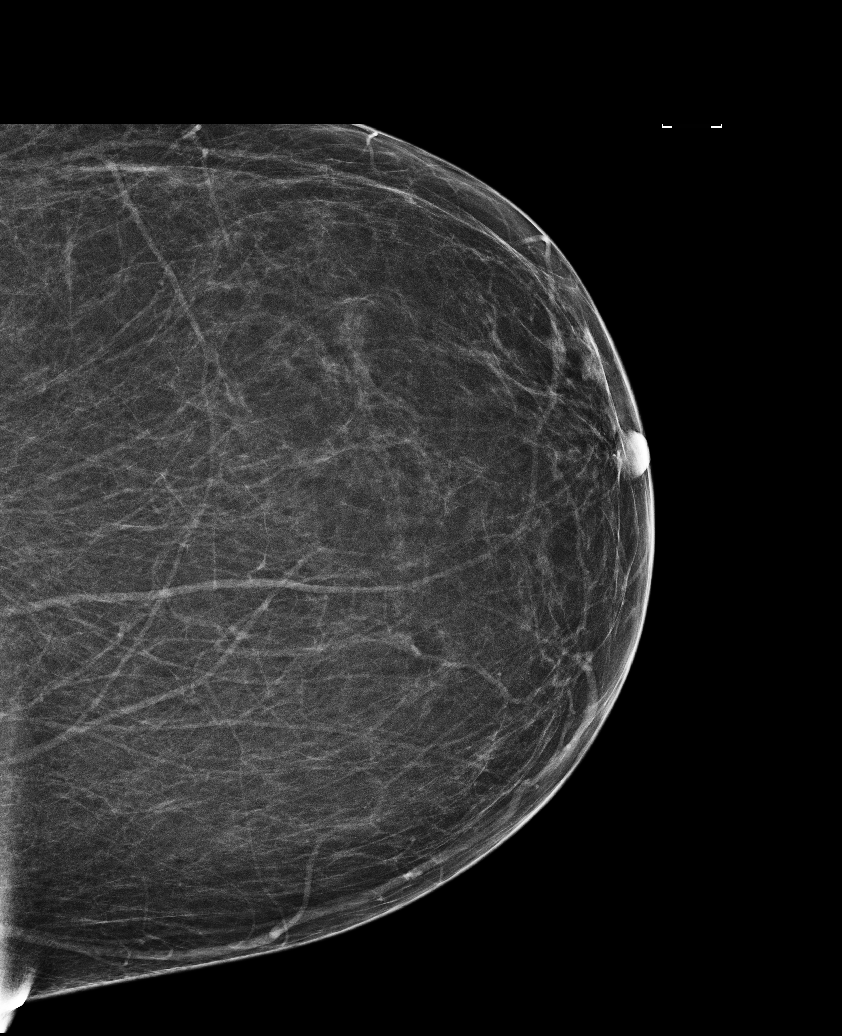

[L MLO]
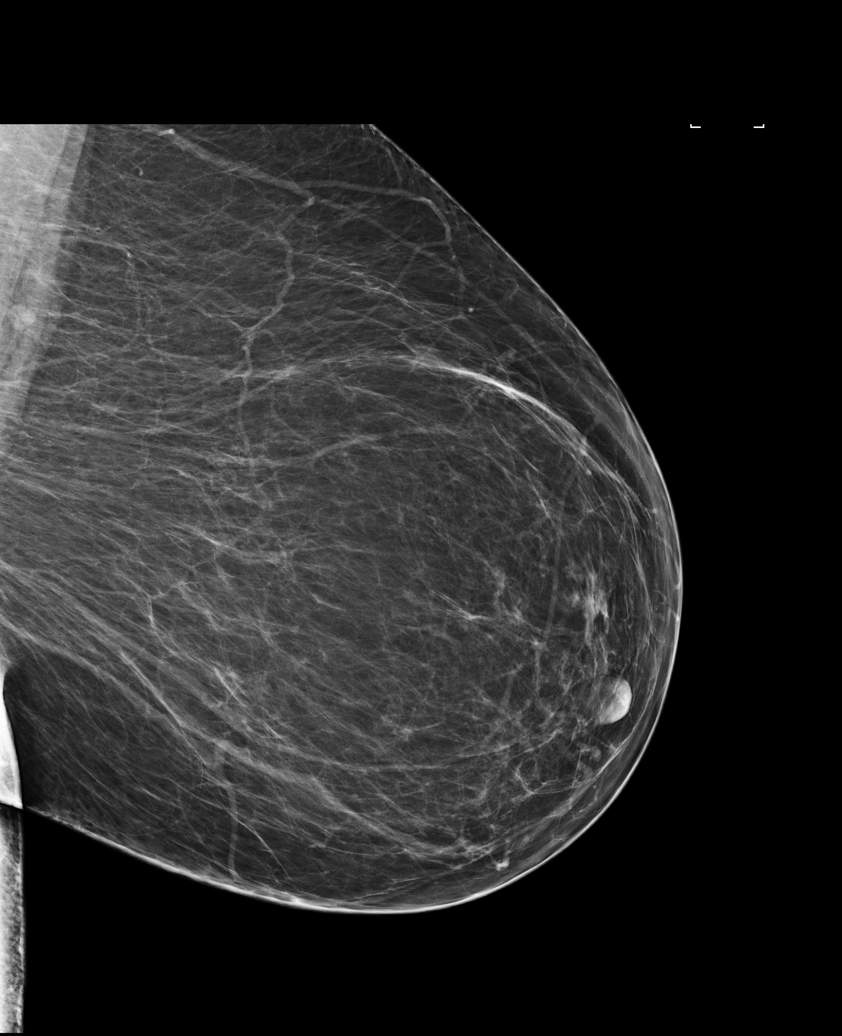

[5 of 5 positions shown; findings below may reference images not displayed]

FINDINGS: There are no findings suspicious for malignancy. Images were
processed with CAD.
IMPRESSION: No mammographic evidence of malignancy. A result letter of this
screening mammogram will be mailed directly to the patient.

RECOMMENDATION:
Screening mammogram in one year. (Code:[id number])

BI-RADS CATEGORY  1: Negative.

## 2022-07-19 ENCOUNTER — Ambulatory Visit: Payer: Medicare HMO | Admitting: Family Medicine

## 2022-08-04 ENCOUNTER — Ambulatory Visit (INDEPENDENT_AMBULATORY_CARE_PROVIDER_SITE_OTHER): Payer: Medicare HMO | Admitting: Family Medicine

## 2022-08-04 ENCOUNTER — Encounter: Payer: Self-pay | Admitting: Family Medicine

## 2022-08-04 VITALS — BP 140/80 | HR 77 | Temp 98.5°F | Ht 65.0 in | Wt 188.0 lb

## 2022-08-04 DIAGNOSIS — E7800 Pure hypercholesterolemia, unspecified: Secondary | ICD-10-CM | POA: Diagnosis not present

## 2022-08-04 DIAGNOSIS — Z1231 Encounter for screening mammogram for malignant neoplasm of breast: Secondary | ICD-10-CM | POA: Diagnosis not present

## 2022-08-04 DIAGNOSIS — I10 Essential (primary) hypertension: Secondary | ICD-10-CM | POA: Diagnosis not present

## 2022-08-04 DIAGNOSIS — E2839 Other primary ovarian failure: Secondary | ICD-10-CM | POA: Diagnosis not present

## 2022-08-04 DIAGNOSIS — E039 Hypothyroidism, unspecified: Secondary | ICD-10-CM

## 2022-08-04 LAB — COMPREHENSIVE METABOLIC PANEL
ALT: 13 U/L (ref 0–35)
AST: 14 U/L (ref 0–37)
Albumin: 4.2 g/dL (ref 3.5–5.2)
Alkaline Phosphatase: 62 U/L (ref 39–117)
BUN: 9 mg/dL (ref 6–23)
CO2: 28 mEq/L (ref 19–32)
Calcium: 9.5 mg/dL (ref 8.4–10.5)
Chloride: 102 mEq/L (ref 96–112)
Creatinine, Ser: 0.75 mg/dL (ref 0.40–1.20)
GFR: 81.05 mL/min (ref >60.00)
Glucose, Bld: 93 mg/dL (ref 70–99)
Potassium: 4.5 mEq/L (ref 3.5–5.1)
Sodium: 140 mEq/L (ref 135–145)
Total Bilirubin: 0.4 mg/dL (ref 0.2–1.2)
Total Protein: 7.1 g/dL (ref 6.0–8.3)

## 2022-08-04 LAB — LIPID PANEL
Cholesterol: 146 mg/dL (ref 0–200)
HDL: 49 mg/dL (ref >39.00)
LDL Cholesterol: 73 mg/dL (ref 0–99)
NonHDL: 96.91
Total CHOL/HDL Ratio: 3
Triglycerides: 121 mg/dL (ref 0.0–149.0)
VLDL: 24.2 mg/dL (ref 0.0–40.0)

## 2022-08-04 LAB — TSH: TSH: 1.28 u[IU]/mL (ref 0.35–5.50)

## 2022-08-04 NOTE — Progress Notes (Signed)
OFFICE VISIT  08/04/2022  CC:  Chief Complaint  Patient presents with   Hypertension   Hyperlipidemia    Pt is fasting   Hypothyroidism   Patient is a 71 y.o. female who presents for 6 mo f/u HTN, HLD, and hypothyroidism. A/P as of last visit: "#1 hypertension.  Well-controlled.  Continue lisinopril 30 mg a day.  2.  Hyperlipidemia.  Tolerating atorvastatin 20 mg a day well. Lipid panel and hepatic panel today.  3.  Hypothyroidism. Continue 112 mcg tab once a day. TSH today.   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 This has responded to Cortisporin otic for her in the past.  Prescribed today."   INTERIM HX: Doing well. Home bp's normal. She is aggravated this morning b/c someone got her credit card info and charged a lot, she has been on phone with creditors, etc.   ROS as above, plus--> no fevers, no CP, no SOB, no wheezing, no cough, no dizziness, no HAs, no rashes, no melena/hematochezia.  No polyuria or polydipsia.  No myalgias or arthralgias.  No focal weakness, paresthesias, or tremors.  No acute vision or hearing abnormalities.  No dysuria or unusual/new urinary urgency or frequency.  No recent changes in lower legs. No n/v/d or abd pain.  No palpitations.    Past Medical History:  Diagnosis Date   Abnormal mammogram of right breast 10/2015   Benign-appearing cysts on f/u diagnostic mammo and ultrasound: recommended stay on annual mammogram screening schedule   Allergy    Cervical cancer screening 12/2017   Pap normal-->no further pap screening indicated.   Cervical polyp 12/2017   resected 01/2018 by Dr. Ailene Rud   Colon cancer screening 01/10/2018   iFOB neg 01/2018.  iFOB neg 01/2019.  Cologuard NEG 08/2019. Colonoscopy w/out polyps 01/2021->recall 10 yrs.   Essential hypertension 2017   GERD (gastroesophageal reflux disease)    Hypercholesterolemia 01/2018   Excellent response to '20mg'$  atorva 2019   Hypothyroidism, postsurgical    Sigmoid  diverticulosis     Past Surgical History:  Procedure Laterality Date   CERVICAL POLYPECTOMY  01/2018   COLONOSCOPY  06/2007   Dr. Sharlett Iles: descending colon and sigmoid colon diverticulosis. Internal hemorrhoids. Otherwise normal.    COLONOSCOPY WITH PROPOFOL N/A 01/25/2021   No polyps. +sigmoid diverticulosis and nonbleeding int hemorr. Procedure: COLONOSCOPY WITH PROPOFOL;  Surgeon: Eloise Harman, DO;  [facility]: [facility];  Service: Endoscopy;  Laterality: N/A;  12:45pm   DEXA  08/2006; 01/2018; 06/2020   2007; T -0.3.  2019 T score -0.5. 2021 T score -0.8   ENDOMETRIAL BIOPSY  12/1999   Benign   THYROIDECTOMY  07/02/2015   Dr. Kelli Churn   TUBAL LIGATION  03/03/1989    Outpatient Medications Prior to Visit  Medication Sig Dispense Refill   atorvastatin (LIPITOR) 20 MG tablet Take 1 tablet (20 mg total) by mouth daily. 90 tablet 3   calcium-vitamin D (OSCAL WITH D) 250-125 MG-UNIT per tablet Take 1 tablet by mouth daily.     cetirizine (ZYRTEC) 10 MG tablet Take 10 mg by mouth daily.     levothyroxine (SYNTHROID) 112 MCG tablet Take 1 tablet (112 mcg total) by mouth daily before breakfast. 90 tablet 3   lisinopril (ZESTRIL) 30 MG tablet Take 1 tablet (30 mg total) by mouth daily. 90 tablet 3   Multiple Vitamins-Minerals (MULTIVITAMIN PO) Take 1 tablet by mouth daily.     NEOMYCIN-POLYMYXIN-HYDROCORTISONE (CORTISPORIN) 1 % SOLN OTIC solution 2 drops each ear three  times a day 10 mL 1   pantoprazole (PROTONIX) 40 MG tablet Take 1 tablet (40 mg total) by mouth daily. 90 tablet 3   No facility-administered medications prior to visit.    No Known Allergies  ROS As per HPI  PE:    08/04/2022    9:54 AM 08/04/2022    9:49 AM 01/21/2022    8:47 AM  Vitals with BMI  Height  '5\' 5"'$    Weight  188 lbs   BMI  76.72   Systolic [phone]  Diastolic 80 78 78  Pulse  77    Physical Exam  Gen: Alert, well appearing.  Patient is oriented to person, place, time, and  situation. AFFECT: pleasant, lucid thought and speech. No further exam today.  LABS:  Last CBC Lab Results  Component Value Date   WBC 6.2 01/21/2022   HGB 12.7 01/21/2022   HCT 38.7 01/21/2022   MCV 90.4 01/21/2022   MCH 29.7 01/21/2022   RDW 12.4 01/21/2022   PLT 226 36/62/9476   Last metabolic panel Lab Results  Component Value Date   GLUCOSE 93 01/21/2022   NA 140 01/21/2022   K 4.2 01/21/2022   CL 104 01/21/2022   CO2 26 01/21/2022   BUN 10 01/21/2022   CREATININE 0.76 01/21/2022   GFRNONAA >60 12/18/2020   CALCIUM 9.5 01/21/2022   PROT 6.6 01/21/2022   ALBUMIN 4.3 12/18/2020   BILITOT 0.4 01/21/2022   ALKPHOS 60 12/18/2020   AST 13 01/21/2022   ALT 13 01/21/2022   ANIONGAP 8 12/18/2020   Last lipids Lab Results  Component Value Date   CHOL 146 01/21/2022   HDL 49 (L) 01/21/2022   LDLCALC 79 01/21/2022   TRIG 99 01/21/2022   CHOLHDL 3.0 01/21/2022   Last hemoglobin A1c Lab Results  Component Value Date   HGBA1C 5.8 10/02/2015   Last thyroid functions Lab Results  Component Value Date   TSH 2.86 01/21/2022   T4TOTAL 11.2 02/25/2019   IMPRESSION AND PLAN:  #1 hypertension, well controlled on lisinopril 30 mg/day. Electrolytes and creatinine today.  2.  Hyperlipidemia, doing well on Lipitor 20 mg a day. Lipid panel and hepatic panel today.  3.Hypoth: Takes 112 mcg T4 qd on empty stomach w/out any other meds. TSH today.  #4 preventative health care: Mammogram and bone density scan ordered today.  An After Visit Summary was printed and given to the patient.  FOLLOW UP: Return in about 6 months (around 02/02/2023) for annual CPE (fasting).  Signed:  Crissie Sickles, MD           08/04/2022

## 2022-08-06 ENCOUNTER — Ambulatory Visit (INDEPENDENT_AMBULATORY_CARE_PROVIDER_SITE_OTHER): Payer: Medicare HMO

## 2022-08-06 DIAGNOSIS — Z0000 Encounter for general adult medical examination without abnormal findings: Secondary | ICD-10-CM | POA: Diagnosis not present

## 2022-08-06 NOTE — Patient Instructions (Signed)

## 2022-08-06 NOTE — Progress Notes (Cosign Needed Addendum)
Subjective:   Marissa Gibbs is a 71 y.o. female who presents for Medicare Annual (Subsequent) preventive examination.  I connected with  Marissa Gibbs on 08/06/22 by an audio only telemedicine application and verified that I am speaking with the correct person using two identifiers.   I discussed the limitations, risks, security and privacy concerns of performing an evaluation and management service by telephone and the availability of in person appointments. I also discussed with the patient that there may be a patient responsible charge related to this service. The patient expressed understanding and verbally consented to this telephonic visit.  Location of Patient: Home Location of Provider: Office  List any persons and their role that are participating in the visit with the patient.   [place], CMA  Review of Systems    Defer to PCP Cardiac Risk Factors include: advanced age (>49mn, >>33women);dyslipidemia;hypertension;family history of premature cardiovascular disease;obesity (BMI >30kg/m2);sedentary lifestyle     Objective:    There were no vitals filed for this visit. There is no height or weight on file to calculate BMI.     08/06/2022    9:40 AM 01/25/2021   11:03 AM 08/14/2019    9:07 AM  Advanced Directives  Does Patient Have a Medical Advance Directive? Yes No No  Type of Advance Directive [place] Chart? No - copy requested    Would patient like information on creating a medical advance directive?  No - Patient declined No - Patient declined    Current Medications (verified) Outpatient Encounter Medications as of 08/06/2022  Medication Sig   atorvastatin (LIPITOR) 20 MG tablet Take 1 tablet (20 mg total) by mouth daily.   calcium-vitamin D (OSCAL WITH D) 250-125 MG-UNIT per tablet Take 1 tablet by mouth daily.   cetirizine (ZYRTEC) 10 MG tablet Take 10 mg by mouth daily.    levothyroxine (SYNTHROID) 112 MCG tablet Take 1 tablet (112 mcg total) by mouth daily before breakfast.   lisinopril (ZESTRIL) 30 MG tablet Take 1 tablet (30 mg total) by mouth daily.   Multiple Vitamins-Minerals (MULTIVITAMIN PO) Take 1 tablet by mouth daily.   NEOMYCIN-POLYMYXIN-HYDROCORTISONE (CORTISPORIN) 1 % SOLN OTIC solution 2 drops each ear three times a day   pantoprazole (PROTONIX) 40 MG tablet Take 1 tablet (40 mg total) by mouth daily.   No facility-administered encounter medications on file as of 08/06/2022.    Allergies (verified) Patient has no known allergies.   History: Past Medical History:  Diagnosis Date   Abnormal mammogram of right breast 10/2015   Benign-appearing cysts on f/u diagnostic mammo and ultrasound: recommended stay on annual mammogram screening schedule   Allergy    Cervical cancer screening 12/2017   Pap normal-->no further pap screening indicated.   Cervical polyp 12/2017   resected 01/2018 by Dr. KAilene Rud  Colon cancer screening 01/10/2018   iFOB neg 01/2018.  iFOB neg 01/2019.  Cologuard NEG 08/2019. Colonoscopy w/out polyps 01/2021->recall 10 yrs.   Essential hypertension 2017   GERD (gastroesophageal reflux disease)    Hypercholesterolemia 01/2018   Excellent response to '20mg'$  atorva 2019   Hypothyroidism, postsurgical    Sigmoid diverticulosis    Past Surgical History:  Procedure Laterality Date   CERVICAL POLYPECTOMY  01/2018   COLONOSCOPY  06/2007   Dr. PSharlett Iles descending colon and sigmoid colon diverticulosis. Internal hemorrhoids. Otherwise normal.    COLONOSCOPY WITH PROPOFOL N/A 01/25/2021  No polyps. +sigmoid diverticulosis and nonbleeding int hemorr. Procedure: COLONOSCOPY WITH PROPOFOL;  Surgeon: Eloise Harman, DO;  [facility];  Service: Endoscopy;  Laterality: N/A;  12:45pm   DEXA  08/2006; 01/2018; 06/2020   2007; T -0.3.  2019 T score -0.5. 2021 T score -0.8   ENDOMETRIAL BIOPSY  12/1999   Benign    THYROIDECTOMY  07/02/2015   Dr. Kelli Churn   TUBAL LIGATION  03/03/1989   Family History  Problem Relation Age of Onset   Arthritis Mother    Breast cancer Mother 28   Hypertension Mother    Diabetes Mother    Heart attack Father    Diabetes Father    Alcohol abuse Father    Thyroid disease Neg Hx    Colon cancer Neg Hx    Colon polyps Neg Hx    Social History   Socioeconomic History   Marital status: Married    Spouse name: Not on file   Number of children: Not on file   Years of education: Not on file   Highest education level: Not on file  Occupational History   Not on file  Tobacco Use   Smoking status: Never   Smokeless tobacco: Never  Vaping Use   Vaping Use: Never used  Substance and Sexual Activity   Alcohol use: No   Drug use: No   Sexual activity: Not on file  Other Topics Concern   Not on file  Social History Narrative   Married, one biologic daughter and one stepson.   Education: 12 grade.   Occupation: Woodworker Tax inspector).   No T/A/Ds.         Social Determinants of Health   Financial Resource Strain: Low Risk  (08/06/2022)   Overall Financial Resource Strain (CARDIA)    Difficulty of Paying Living Expenses: Not hard at all  Food Insecurity: No Food Insecurity (08/06/2022)   Hunger Vital Sign    Worried About Running Out of Food in the Last Year: Never true    Ran Out of Food in the Last Year: Never true  Transportation Needs: No Transportation Needs (08/06/2022)   PRAPARE - Hydrologist (Medical): No    Lack of Transportation (Non-Medical): No  Physical Activity: Insufficiently Active (08/06/2022)   Exercise Vital Sign    Days of Exercise per Week: 1 day    Minutes of Exercise per Session: 10 min  Stress: No Stress Concern Present (08/06/2022)   [place]    Feeling of Stress : Not at all  Social Connections: Unknown (08/06/2022)   Social  Connection and Isolation Panel [NHANES]    Frequency of Communication with Friends and Family: More than three times a week    Frequency of Social Gatherings with Friends and Family: Patient refused    Attends Religious Services: Patient refused    Marine scientist or Organizations: Yes    Attends Music therapist: Patient refused    Marital Status: Married    Tobacco Counseling Counseling given: Not Answered   Clinical Intake:  Pre-visit preparation completed: Yes  Pain : No/denies pain     Nutritional Status: BMI > 30  Obese Nutritional Risks: None Diabetes: No  How often do you need to have someone help you when you read instructions, pamphlets, or other written materials from your doctor or pharmacy?: 1 - Never  Diabetic?no  Interpreter Needed?: No  Activities of Daily Living    08/06/2022    9:26 AM  In your present state of health, do you have any difficulty performing the following activities:  Hearing? 0  Vision? 0  Difficulty concentrating or making decisions? 0  Walking or climbing stairs? 0  Dressing or bathing? 0  Doing errands, shopping? 0  Preparing Food and eating ? N  Using the Toilet? N  In the past six months, have you accidently leaked urine? N  Do you have problems with loss of bowel control? N  Managing your Medications? N  Managing your Finances? N  Housekeeping or managing your Housekeeping? N    Patient Care Team: Tammi Sou, MD as PCP - General (Family Medicine) Vania Rea, MD as Consulting Physician (Obstetrics and Gynecology) Eloise Harman, DO as Consulting Physician (Gastroenterology) Marchia Bond, MD as Consulting Physician (Orthopedic Surgery)  Indicate any recent Medical Services you may have received from other than [facility] providers in the past year (date may be approximate).     Assessment:   This is a routine wellness examination for Charly.  Hearing/Vision screen No results  found.  Dietary issues and exercise activities discussed: Current Exercise Habits: Home exercise routine, Type of exercise: walking, Time (Minutes): 10, Frequency (Times/Week): 1, Weekly Exercise (Minutes/Week): 10, Intensity: Mild   Goals Addressed   None   Depression Screen    08/06/2022    9:29 AM 01/21/2022    8:11 AM 01/13/2021    8:07 AM 01/09/2020    1:08 PM 08/14/2019    9:08 AM 12/31/2018    8:53 AM 12/21/2017    9:37 AM  PHQ 2/9 Scores  PHQ - 2 Score 0 0 0 1 0 0 1    Fall Risk    08/06/2022    9:30 AM 01/21/2022    8:11 AM 01/13/2021    8:08 AM 01/09/2020    1:07 PM 08/14/2019    9:08 AM  [place] in the past year? 0 0 0 0 0  Number falls in past yr: 0 0 0 0 0  Injury with Fall? 0 0 0 0 0  Follow up Falls evaluation completed Falls evaluation completed Falls evaluation completed Falls evaluation completed Falls prevention discussed    FALL RISK PREVENTION PERTAINING TO THE HOME:  Any stairs in or around the home? No  If so, are there any without handrails? No  Home free of loose throw rugs in walkways, pet beds, electrical cords, etc? No  Adequate lighting in your home to reduce risk of falls? Yes   ASSISTIVE DEVICES UTILIZED TO PREVENT FALLS:  Life alert? No  Use of a cane, walker or w/c? No  Grab bars in the bathroom? No  Shower chair or bench in shower? No  Elevated toilet seat or a handicapped toilet? No   TIMED UP AND GO:  Was the test performed? No .  Length of time to ambulate 10 feet: n/a sec.     Cognitive Function:    08/14/2019    9:09 AM  MMSE - Mini Mental State Exam  Orientation to time 5  Orientation to Place 5  Registration 3  Attention/ Calculation 5  Recall 3  Language- name 2 objects 2  Language- repeat 1  Language- follow 3 step command 3  Language- read & follow direction 1  Write a sentence 1  Copy design 1  Total score 30        08/06/2022  10:17 AM  6CIT Screen  What Year? 0 points  What month? 0 points  What time?  0 points  Count back from 20 0 points  Repeat phrase 0 points    Immunizations Immunization History  Administered Date(s) Administered   Fluad Quad(high Dose 65+) 08/14/2019, 11/10/2020, 01/21/2022   Influenza, High Dose Seasonal PF 11/10/2017, 08/14/2018   Influenza,inj,Quad PF,6+ Mos 10/02/2015, 10/10/2016   Moderna Sars-Covid-2 Vaccination 03/31/2020, 04/28/2020, 12/22/2020   Pneumococcal Conjugate-13 01/03/2018   Pneumococcal Polysaccharide-23 12/31/2018   Tdap 07/29/2014   Zoster Recombinat (Shingrix) 11/30/2020, 02/19/2021    TDAP status: Up to date  Flu Vaccine status: Due, Education has been provided regarding the importance of this vaccine. Advised may receive this vaccine at local pharmacy or Health Dept. Aware to provide a copy of the vaccination record if obtained from local pharmacy or Health Dept. Verbalized acceptance and understanding.  Pneumococcal vaccine status: Up to date  Covid-19 vaccine status: Completed vaccines  Qualifies for Shingles Vaccine? Yes   Zostavax completed No   Shingrix Completed?: Yes  Screening Tests Health Maintenance  Topic Date Due   INFLUENZA VACCINE  07/05/2022   COVID-19 Vaccine (4 - Moderna series) 08/20/2022 (Originally 02/16/2021)   MAMMOGRAM  06/25/2023   TETANUS/TDAP  07/29/2024   COLONOSCOPY (Pts 45-43yr Insurance coverage will need to be confirmed)  01/25/2031   Pneumonia Vaccine 70 Years old  Completed   DEXA SCAN  Completed   Hepatitis C Screening  Completed   Zoster Vaccines- Shingrix  Completed   HPV VACCINES  Aged Out    Health Maintenance  Health Maintenance Due  Topic Date Due   INFLUENZA VACCINE  07/05/2022    Colorectal cancer screening: Type of screening: Colonoscopy. Completed 01/25/2021. Repeat every 10 years  Mammogram status: Completed 06/24/2021. Repeat every year  Bone Density status: Ordered 08/04/2022. Pt provided with contact info and advised to call to schedule appt.  Lung Cancer  Screening: (Low Dose CT Chest recommended if Age 70-80years, 30 pack-year currently smoking OR have quit w/in 15years.) does not qualify.   Lung Cancer Screening Referral: n/a  Additional Screening:  Hepatitis C Screening: does qualify; Completed 01/03/2018  Vision Screening: Recommended annual ophthalmology exams for early detection of glaucoma and other disorders of the eye. Is the patient up to date with their annual eye exam?  Yes  Who is the provider or what is the name of the office in which the patient attends annual eye exams? [facility] If pt is not established with a provider, would they like to be referred to a provider to establish care? No .   Dental Screening: Recommended annual dental exams for proper oral hygiene  Community Resource Referral / Chronic Care Management: CRR required this visit?  No   CCM required this visit?  No      Plan:     I have personally reviewed and noted the following in the patient's chart:   Medical and social history Use of alcohol, tobacco or illicit drugs  Current medications and supplements including opioid prescriptions. Patient is not currently taking opioid prescriptions. Functional ability and status Nutritional status Physical activity Advanced directives List of other physicians Hospitalizations, surgeries, and ER visits in previous 12 months Vitals Screenings to include cognitive, depression, and falls Referrals and appointments  In addition, I have reviewed and discussed with patient certain preventive protocols, quality metrics, and best practice recommendations. A written personalized care plan for preventive services as well as general preventive health  recommendations were provided to patient.     Octaviano Glow, CMA   08/06/2022   Nurse Notes: Non-Face to Face or Face to Face 7 minute visit Encounter   Ms. Hamlett , Thank you for taking time to come for your Medicare Wellness Visit. I appreciate your  ongoing commitment to your health goals. Please review the following plan we discussed and let me know if I can assist you in the future.   These are the goals we discussed:  Goals      educational material send every 6 months     Evidence-based guidance:  Promote initial use of ambulatory blood pressure measurements (for 3 days) to rule out "Leshaun Biebel-coat" effect; identify masked hypertension and presence or absence of nocturnal "dipping" of blood pressure.   Encourage continued use of home blood pressure monitoring and recording in blood pressure log; include symptoms of hypotension or potential medication side effects in log.  Review blood pressure measurements taken inside and outside of the provider office; establish baseline and monitor trends; compare to target ranges or patient goal.  Share overall cardiovascular risk with patient; encourage changes to lifestyle risk factors, including alcohol consumption, smoking, inadequate exercise, poor dietary habits and stress.   Notes:  [phone] RN sent educational book a matter of choice blood pressure control [phone] [place] has discontinued the educational mailing program     Patient Stated     Increase water intake.         This is a list of the screening recommended for you and due dates:  Health Maintenance  Topic Date Due   Flu Shot  07/05/2022   COVID-19 Vaccine (4 - Moderna series) 08/20/2022*   Mammogram  06/25/2023   Tetanus Vaccine  07/29/2024   Colon Cancer Screening  01/25/2031   Pneumonia Vaccine  Completed   DEXA scan (bone density measurement)  Completed   Hepatitis C Screening: USPSTF Recommendation to screen - Ages 56-79 yo.  Completed   Zoster (Shingles) Vaccine  Completed   HPV Vaccine  Aged Out  *Topic was postponed. The date shown is not the original due date.

## 2022-08-15 DIAGNOSIS — H2513 Age-related nuclear cataract, bilateral: Secondary | ICD-10-CM | POA: Diagnosis not present

## 2022-08-15 DIAGNOSIS — H52223 Regular astigmatism, bilateral: Secondary | ICD-10-CM | POA: Diagnosis not present

## 2022-08-15 DIAGNOSIS — H04123 Dry eye syndrome of bilateral lacrimal glands: Secondary | ICD-10-CM | POA: Diagnosis not present

## 2022-08-15 DIAGNOSIS — H5203 Hypermetropia, bilateral: Secondary | ICD-10-CM | POA: Diagnosis not present

## 2022-08-15 DIAGNOSIS — H524 Presbyopia: Secondary | ICD-10-CM | POA: Diagnosis not present

## 2022-08-15 DIAGNOSIS — H35363 Drusen (degenerative) of macula, bilateral: Secondary | ICD-10-CM | POA: Diagnosis not present

## 2022-08-15 DIAGNOSIS — H1045 Other chronic allergic conjunctivitis: Secondary | ICD-10-CM | POA: Diagnosis not present

## 2022-08-15 DIAGNOSIS — H1789 Other corneal scars and opacities: Secondary | ICD-10-CM | POA: Diagnosis not present

## 2022-08-25 ENCOUNTER — Other Ambulatory Visit (HOSPITAL_COMMUNITY): Payer: Self-pay | Admitting: Family Medicine

## 2022-08-25 DIAGNOSIS — Z1231 Encounter for screening mammogram for malignant neoplasm of breast: Secondary | ICD-10-CM

## 2022-09-08 ENCOUNTER — Ambulatory Visit (HOSPITAL_COMMUNITY): Payer: Medicare HMO

## 2022-09-12 ENCOUNTER — Ambulatory Visit (HOSPITAL_COMMUNITY)
Admission: RE | Admit: 2022-09-12 | Discharge: 2022-09-12 | Disposition: A | Discharge status: Home / Self Care | Payer: Medicare HMO | Source: Ambulatory Visit | Attending: Family Medicine | Admitting: Family Medicine

## 2022-09-12 DIAGNOSIS — Z1231 Encounter for screening mammogram for malignant neoplasm of breast: Secondary | ICD-10-CM | POA: Insufficient documentation

## 2022-10-12 ENCOUNTER — Other Ambulatory Visit: Payer: Self-pay | Admitting: Family Medicine

## 2022-11-04 ENCOUNTER — Other Ambulatory Visit: Payer: Self-pay | Admitting: Family Medicine

## 2023-01-16 ENCOUNTER — Other Ambulatory Visit: Payer: Self-pay | Admitting: Family Medicine

## 2023-02-01 ENCOUNTER — Ambulatory Visit: Payer: Medicare HMO

## 2023-02-01 ENCOUNTER — Ambulatory Visit
Admission: RE | Admit: 2023-02-01 | Discharge: 2023-02-01 | Disposition: A | Discharge status: Home / Self Care | Payer: Medicare HMO | Source: Ambulatory Visit | Attending: Family Medicine | Admitting: Family Medicine

## 2023-02-01 DIAGNOSIS — Z780 Asymptomatic menopausal state: Secondary | ICD-10-CM | POA: Diagnosis not present

## 2023-02-01 DIAGNOSIS — E2839 Other primary ovarian failure: Secondary | ICD-10-CM

## 2023-02-02 ENCOUNTER — Encounter: Payer: Self-pay | Admitting: Family Medicine

## 2023-02-06 NOTE — Patient Instructions (Signed)

## 2023-02-08 ENCOUNTER — Encounter: Payer: Self-pay | Admitting: Family Medicine

## 2023-02-08 ENCOUNTER — Ambulatory Visit (INDEPENDENT_AMBULATORY_CARE_PROVIDER_SITE_OTHER): Payer: Medicare HMO | Admitting: Family Medicine

## 2023-02-08 VITALS — BP 174/99 | HR 73 | Temp 97.9°F | Ht 60.0 in | Wt 184.2 lb

## 2023-02-08 DIAGNOSIS — E039 Hypothyroidism, unspecified: Secondary | ICD-10-CM

## 2023-02-08 DIAGNOSIS — Z0000 Encounter for general adult medical examination without abnormal findings: Secondary | ICD-10-CM | POA: Diagnosis not present

## 2023-02-08 DIAGNOSIS — E7800 Pure hypercholesterolemia, unspecified: Secondary | ICD-10-CM | POA: Diagnosis not present

## 2023-02-08 DIAGNOSIS — I10 Essential (primary) hypertension: Secondary | ICD-10-CM | POA: Diagnosis not present

## 2023-02-08 MED ORDER — ATORVASTATIN CALCIUM 20 MG PO TABS: 20.0000 mg | ORAL_TABLET | Freq: Every day | ORAL | 1 refills | Status: DC

## 2023-02-08 MED ORDER — LEVOTHYROXINE SODIUM 112 MCG PO TABS: 112.0000 ug | ORAL_TABLET | Freq: Every day | ORAL | 1 refills | Status: DC

## 2023-02-08 MED ORDER — LISINOPRIL 30 MG PO TABS: 30.0000 mg | ORAL_TABLET | Freq: Every day | ORAL | 1 refills | Status: DC

## 2023-02-08 MED ORDER — PANTOPRAZOLE SODIUM 40 MG PO TBEC: 40.0000 mg | DELAYED_RELEASE_TABLET | Freq: Every day | ORAL | 1 refills | Status: DC

## 2023-02-08 NOTE — Progress Notes (Signed)
Office Note 02/08/2023  CC:  Chief Complaint  Patient presents with   Annual Exam    Pt is fasting   Patient is a 72 y.o. female who is here for annual health maintenance exam and 65-monthfollow-up hypertension, hyperlipidemia, and hypothyroidism. A/P as of last visit: "#1 hypertension, well controlled on lisinopril 30 mg/day. Electrolytes and creatinine today.   2.  Hyperlipidemia, doing well on Lipitor 20 mg a day. Lipid panel and hepatic panel today.   3.Hypoth: Takes 112 mcg T4 qd on empty stomach w/out any other meds. TSH today.   #4 preventative health care: Mammogram and bone density scan ordered today."  INTERIM HX: JGordieis feeling well. Home blood pressures ranging in the 130s most of the time occasionally 140s.  Diastolics usually [id number] occasionally over 80. Nothing like the blood pressure here today.   Past Medical History:  Diagnosis Date   Abnormal mammogram of right breast 10/2015   Benign-appearing cysts on f/u diagnostic mammo and ultrasound: recommended stay on annual mammogram screening schedule   Allergy    Cervical cancer screening 12/2017   Pap normal-->no further pap screening indicated.   Cervical polyp 12/2017   resected 01/2018 by Dr. KAilene Rud  Colon cancer screening 01/10/2018   iFOB neg 01/2018.  iFOB neg 01/2019.  Cologuard NEG 08/2019. Colonoscopy w/out polyps 01/2021->recall 10 yrs.   Essential hypertension 2017   GERD (gastroesophageal reflux disease)    Hypercholesterolemia 01/2018   Excellent response to '20mg'$  atorva 2019   Hypothyroidism, postsurgical    Sigmoid diverticulosis     Past Surgical History:  Procedure Laterality Date   CERVICAL POLYPECTOMY  01/2018   COLONOSCOPY  06/2007   Dr. PSharlett Iles descending colon and sigmoid colon diverticulosis. Internal hemorrhoids. Otherwise normal.    COLONOSCOPY WITH PROPOFOL N/A 01/25/2021   No polyps. +sigmoid diverticulosis and nonbleeding int hemorr. Procedure: COLONOSCOPY WITH  PROPOFOL;  Surgeon: CEloise Harman DO;  Location: [facility];  Service: Endoscopy;  Laterality: N/A;  12:45pm   DEXA  08/2006; 01/2018; 06/2020   2007; T -0.3.  2019 T score -0.5. 2021 T score -0.8.  2024 T score -0.9   ENDOMETRIAL BIOPSY  12/1999   Benign   THYROIDECTOMY  07/02/2015   Dr. BKelli Churn  TUBAL LIGATION  03/03/1989    Family History  Problem Relation Age of Onset   Arthritis Mother    Breast cancer Mother [phone]  Hypertension Mother    Diabetes Mother    Heart attack Father    Diabetes Father    Alcohol abuse Father    Thyroid disease Neg Hx    Colon cancer Neg Hx    Colon polyps Neg Hx     Social History   Socioeconomic History   Marital status: Married    Spouse name: Not on file   Number of children: Not on file   Years of education: Not on file   Highest education level: Not on file  Occupational History   Not on file  Tobacco Use   Smoking status: Never   Smokeless tobacco: Never  Vaping Use   Vaping Use: Never used  Substance and Sexual Activity   Alcohol use: No   Drug use: No   Sexual activity: Not on file  Other Topics Concern   Not on file  Social History Narrative   Married, one biologic daughter and one stepson.   Education: 12 grade.   Occupation: Woodworker (Tax inspector.   No T/A/Ds.  Social Determinants of Health   Financial Resource Strain: Low Risk  (08/06/2022)   Overall Financial Resource Strain (CARDIA)    Difficulty of Paying Living Expenses: Not hard at all  Food Insecurity: No Food Insecurity (08/06/2022)   Hunger Vital Sign    Worried About Running Out of Food in the Last Year: Never true    Ran Out of Food in the Last Year: Never true  Transportation Needs: No Transportation Needs (08/06/2022)   PRAPARE - Hydrologist (Medical): No    Lack of Transportation (Non-Medical): No  Physical Activity: Insufficiently Active (08/06/2022)   Exercise Vital Sign    Days of Exercise per  Week: 1 day    Minutes of Exercise per Session: 10 min  Stress: No Stress Concern Present (08/06/2022)   [place]    Feeling of Stress : Not at all  Social Connections: Unknown (08/06/2022)   Social Connection and Isolation Panel [NHANES]    Frequency of Communication with Friends and Family: More than three times a week    Frequency of Social Gatherings with Friends and Family: Patient refused    Attends Religious Services: Patient refused    Active Member of Clubs or Organizations: Yes    Attends Archivist Meetings: Patient refused    Marital Status: Married  Human resources officer Violence: Not At Risk (08/06/2022)   Humiliation, Afraid, Rape, and Kick questionnaire    Fear of Current or Ex-Partner: No    Emotionally Abused: No    Physically Abused: No    Sexually Abused: No    Outpatient Medications Prior to Visit  Medication Sig Dispense Refill   atorvastatin (LIPITOR) 20 MG tablet TAKE 1 TABLET DAILY 30 tablet 0   calcium-vitamin D (OSCAL WITH D) 250-125 MG-UNIT per tablet Take 1 tablet by mouth daily.     cetirizine (ZYRTEC) 10 MG tablet Take 10 mg by mouth daily.     levothyroxine (SYNTHROID) 112 MCG tablet TAKE 1 TABLET DAILY BEFORE BREAKFAST 30 tablet 0   lisinopril (ZESTRIL) 30 MG tablet TAKE 1 TABLET DAILY 90 tablet 1   Multiple Vitamins-Minerals (MULTIVITAMIN PO) Take 1 tablet by mouth daily.     NEOMYCIN-POLYMYXIN-HYDROCORTISONE (CORTISPORIN) 1 % SOLN OTIC solution 2 drops each ear three times a day 10 mL 1   pantoprazole (PROTONIX) 40 MG tablet TAKE 1 TABLET DAILY 90 tablet 1   No facility-administered medications prior to visit.    No Known Allergies  Review of Systems  Constitutional:  Negative for appetite change, chills, fatigue and fever.  HENT:  Negative for congestion, dental problem, ear pain and sore throat.   Eyes:  Negative for discharge, redness and visual disturbance.  Respiratory:   Negative for cough, chest tightness, shortness of breath and wheezing.   Cardiovascular:  Negative for chest pain, palpitations and leg swelling.  Gastrointestinal:  Negative for abdominal pain, blood in stool, diarrhea, nausea and vomiting.  Genitourinary:  Negative for difficulty urinating, dysuria, flank pain, frequency, hematuria and urgency.  Musculoskeletal:  Negative for arthralgias, back pain, joint swelling, myalgias and neck stiffness.  Skin:  Negative for pallor and rash.  Neurological:  Negative for dizziness, speech difficulty, weakness and headaches.  Hematological:  Negative for adenopathy. Does not bruise/bleed easily.  Psychiatric/Behavioral:  Negative for confusion and sleep disturbance. The patient is not nervous/anxious.     PE;    02/08/2023    8:07 AM 02/08/2023  8:04 AM 08/04/2022    9:54 AM  Vitals with BMI  Height  '5\' 0"'$    Weight  184 lbs 3 oz   BMI  99991111   Systolic [id number]  Diastolic 99 85 80  Pulse  73   Exam chaperoned by Shepard General, CMA  Gen: Alert, well appearing.  Patient is oriented to person, place, time, and situation. AFFECT: pleasant, lucid thought and speech. ENT: Ears: EACs clear, normal epithelium.  TMs with good light reflex and landmarks bilaterally.  Eyes: no injection, icteris, swelling, or exudate.  EOMI, PERRLA. Nose: no drainage or turbinate edema/swelling.  No injection or focal lesion.  Mouth: lips without lesion/swelling.  Oral mucosa pink and moist.  Dentition intact and without obvious caries or gingival swelling.  Oropharynx without erythema, exudate, or swelling.  Neck: supple/nontender.  No LAD, mass, or TM.  Carotid pulses 2+ bilaterally, without bruits. CV: RRR, no m/r/g.   LUNGS: CTA bilat, nonlabored resps, good aeration in all lung fields. ABD: soft, NT, ND, BS normal.  No hepatospenomegaly or mass.  No bruits. EXT: no clubbing, cyanosis, or edema.  Musculoskeletal: no joint swelling, erythema, warmth, or tenderness.   ROM of all joints intact. Skin - no sores or suspicious lesions or rashes or color changes  Pertinent labs:  Lab Results  Component Value Date   TSH 1.28 08/04/2022   Lab Results  Component Value Date   WBC 6.2 01/21/2022   HGB 12.7 01/21/2022   HCT 38.7 01/21/2022   MCV 90.4 01/21/2022   PLT 226 01/21/2022   Lab Results  Component Value Date   CREATININE 0.75 08/04/2022   BUN 9 08/04/2022   NA 140 08/04/2022   K 4.5 08/04/2022   CL 102 08/04/2022   CO2 28 08/04/2022   Lab Results  Component Value Date   ALT 13 08/04/2022   AST 14 08/04/2022   ALKPHOS 62 08/04/2022   BILITOT 0.4 08/04/2022   Lab Results  Component Value Date   CHOL 146 08/04/2022   Lab Results  Component Value Date   HDL 49.00 08/04/2022   Lab Results  Component Value Date   LDLCALC 73 08/04/2022   Lab Results  Component Value Date   TRIG 121.0 08/04/2022   Lab Results  Component Value Date   CHOLHDL 3 08/04/2022   Lab Results  Component Value Date   HGBA1C 5.8 10/02/2015   ASSESSMENT AND PLAN:   #1 health maintenance exam: Reviewed age and gender appropriate health maintenance issues (prudent diet, regular exercise, health risks of tobacco and excessive alcohol, use of seatbelts, fire alarms in home, use of sunscreen).  Also reviewed age and gender appropriate health screening as well as vaccine recommendations. Vaccines: All up-to-date Labs: Fasting health panel Cervical ca screening: No further screening is indicated Breast ca screening: Next mammogram due October 2024 Colon ca screening: No polyps 2022.  Consider repeat 2032. Osteoporosis screening: Bone density normal February 2024.  Repeat 2 years.  #2 hypertension, historically well-controlled.  Discordant readings from home measurements and office measurements lately (signif elevated here today). No changes today but I recommended she check her blood pressure and heart rate every day and write these down and bring back for  review with me in 2 weeks. Electrolytes and creatinine today.  #3 hyperlipidemia, doing well on atorvastatin 20 mg a day.  4.  Hypothyroidism, continue 112 mcg levothyroxine daily. TSH today.  An After Visit Summary was printed and given to the patient.  FOLLOW UP:  No follow-ups on file.  Signed:  Crissie Sickles, MD           02/08/2023

## 2023-02-09 LAB — COMPREHENSIVE METABOLIC PANEL
AG Ratio: 1.6 (calc) (ref 1.0–2.5)
ALT: 11 U/L (ref 6–29)
AST: 12 U/L (ref 10–35)
Albumin: 4.3 g/dL (ref 3.6–5.1)
Alkaline phosphatase (APISO): 66 U/L (ref 37–153)
BUN: 8 mg/dL (ref 7–25)
CO2: 24 mmol/L (ref 20–32)
Calcium: 9.9 mg/dL (ref 8.6–10.4)
Chloride: 102 mmol/L (ref 98–110)
Creat: 0.83 mg/dL (ref 0.60–1.00)
Globulin: 2.7 g/dL (calc) (ref 1.9–3.7)
Glucose, Bld: 99 mg/dL (ref 65–99)
Potassium: 4.6 mmol/L (ref 3.5–5.3)
Sodium: 139 mmol/L (ref 135–146)
Total Bilirubin: 0.4 mg/dL (ref 0.2–1.2)
Total Protein: 7 g/dL (ref 6.1–8.1)

## 2023-02-09 LAB — LIPID PANEL
Cholesterol: 140 mg/dL (ref <200)
HDL: 47 mg/dL — ABNORMAL LOW (ref >=50)
LDL Cholesterol (Calc): 76 mg/dL (calc)
Non-HDL Cholesterol (Calc): 93 mg/dL (calc) (ref <130)
Total CHOL/HDL Ratio: 3 (calc) (ref <5.0)
Triglycerides: 83 mg/dL (ref <150)

## 2023-02-09 LAB — CBC
HCT: 40.5 % (ref 35.0–45.0)
Hemoglobin: 13.5 g/dL (ref 11.7–15.5)
MCH: 29.7 pg (ref 27.0–33.0)
MCHC: 33.3 g/dL (ref 32.0–36.0)
MCV: 89.2 fL (ref 80.0–100.0)
MPV: 10.9 fL (ref 7.5–12.5)
Platelets: 253 10*3/uL (ref 140–400)
RBC: 4.54 10*6/uL (ref 3.80–5.10)
RDW: 12.3 % (ref 11.0–15.0)
WBC: 7.2 10*3/uL (ref 3.8–10.8)

## 2023-02-09 LAB — TSH: TSH: 1.43 mIU/L (ref 0.40–4.50)

## 2023-02-16 DIAGNOSIS — R1032 Left lower quadrant pain: Secondary | ICD-10-CM | POA: Diagnosis not present

## 2023-03-06 ENCOUNTER — Encounter: Payer: Self-pay | Admitting: Family Medicine

## 2023-03-06 ENCOUNTER — Ambulatory Visit (INDEPENDENT_AMBULATORY_CARE_PROVIDER_SITE_OTHER): Payer: Medicare HMO | Admitting: Family Medicine

## 2023-03-06 VITALS — BP 150/78 | HR 80 | Temp 98.4°F | Ht 60.0 in | Wt 182.0 lb

## 2023-03-06 DIAGNOSIS — H8110 Benign paroxysmal vertigo, unspecified ear: Secondary | ICD-10-CM | POA: Diagnosis not present

## 2023-03-06 DIAGNOSIS — I10 Essential (primary) hypertension: Secondary | ICD-10-CM

## 2023-03-06 DIAGNOSIS — R42 Dizziness and giddiness: Secondary | ICD-10-CM

## 2023-03-06 NOTE — Progress Notes (Signed)
OFFICE VISIT  03/06/2023  CC:  Chief Complaint  Patient presents with   Medical Management of Chronic Issues    hypertension    Patient is a 72 y.o. female who presents for 3-week follow-up elevated blood pressures. A/P as of last visit: "hypertension, historically well-controlled.  Discordant readings from home measurements and office measurements lately (signif elevated here today). No changes today but I recommended she check her blood pressure and heart rate every day and write these down and bring back for review with me in 2 weeks. Electrolytes and creatinine today."  INTERIM HX: Not feeling too well today. Describes the last 3 days having dizziness. No known distinctive trigger but does definitely seem to be worse when standing.  On Monday she does recall having a period of feeling very cold and shaking for a while.  No temperature checked. Having more significant nausea with each worsening of her dizziness.  At times she does endorse a feeling of movement although hesitates to endorse complete spinning sensation.  No headaches.  Describes it as feeling "fullness "in the head.  No vomiting.  No diarrhea.  No ringing in the ears or hearing changes.  ROS as above, plus--> no CP, no SOB, no wheezing, no cough, , no rashes, no melena/hematochezia.  No polyuria or polydipsia.  No myalgias or arthralgias.  No focal weakness, paresthesias, or tremors.  No acute vision or hearing abnormalities.  No dysuria or unusual/new urinary urgency or frequency.  No recent changes in lower legs. No abdominal pain no palpitations.      Home bps avg 135 syst, 75 diast, HR 70s    Past Medical History:  Diagnosis Date   Abnormal mammogram of right breast 10/2015   Benign-appearing cysts on f/u diagnostic mammo and ultrasound: recommended stay on annual mammogram screening schedule   Allergy    Cervical cancer screening 12/2017   Pap normal-->no further pap screening indicated.   Cervical polyp  12/2017   resected 01/2018 by Dr. Ailene Rud   Colon cancer screening 01/10/2018   iFOB neg 01/2018.  iFOB neg 01/2019.  Cologuard NEG 08/2019. Colonoscopy w/out polyps 01/2021->recall 10 yrs.   Essential hypertension 2017   GERD (gastroesophageal reflux disease)    Hypercholesterolemia 01/2018   Excellent response to 20mg  atorva 2019   Hypothyroidism, postsurgical    Sigmoid diverticulosis     Past Surgical History:  Procedure Laterality Date   CERVICAL POLYPECTOMY  01/2018   COLONOSCOPY  06/2007   Dr. Sharlett Iles: descending colon and sigmoid colon diverticulosis. Internal hemorrhoids. Otherwise normal.    COLONOSCOPY WITH PROPOFOL N/A 01/25/2021   No polyps. +sigmoid diverticulosis and nonbleeding int hemorr. Procedure: COLONOSCOPY WITH PROPOFOL;  Surgeon: Eloise Harman, DO;  Location: [facility];  Service: Endoscopy;  Laterality: N/A;  12:45pm   DEXA  08/2006; 01/2018; 06/2020   2007; T -0.3.  2019 T score -0.5. 2021 T score -0.8.  2024 T score -0.9   ENDOMETRIAL BIOPSY  12/1999   Benign   THYROIDECTOMY  07/02/2015   Dr. Kelli Churn   TUBAL LIGATION  03/03/1989    Outpatient Medications Prior to Visit  Medication Sig Dispense Refill   atorvastatin (LIPITOR) 20 MG tablet Take 1 tablet (20 mg total) by mouth daily. 90 tablet 1   calcium-vitamin D (OSCAL WITH D) 250-125 MG-UNIT per tablet Take 1 tablet by mouth daily.     cetirizine (ZYRTEC) 10 MG tablet Take 10 mg by mouth daily.     levothyroxine (SYNTHROID) 112 MCG  tablet Take 1 tablet (112 mcg total) by mouth daily before breakfast. 90 tablet 1   lisinopril (ZESTRIL) 30 MG tablet Take 1 tablet (30 mg total) by mouth daily. 90 tablet 1   Multiple Vitamins-Minerals (MULTIVITAMIN PO) Take 1 tablet by mouth daily.     NEOMYCIN-POLYMYXIN-HYDROCORTISONE (CORTISPORIN) 1 % SOLN OTIC solution 2 drops each ear three times a day 10 mL 1   pantoprazole (PROTONIX) 40 MG tablet Take 1 tablet (40 mg total) by mouth daily. 90 tablet 1   No  facility-administered medications prior to visit.    No Known Allergies  Review of Systems As per HPI  PE:    03/06/2023    8:29 AM 03/06/2023    8:10 AM 02/08/2023    8:07 AM  Vitals with BMI  Height  5\' 0"    Weight  182 lbs   BMI  [id number]   Systolic [id number] 0000000 [id number]  Diastolic 78 81 99  Pulse  80      Physical Exam  Gen: Alert, no distress.  Patient is oriented to person, place, time, and situation. AFFECT: pleasant, lucid thought and speech. [id number]: no injection, icteris, swelling, or exudate.  EOMI, PERRLA.  No nystagmus. Mouth: lips without lesion/swelling.  Oral mucosa pink and moist. Oropharynx without erythema, exudate, or swelling.  CV: RRR, no m/r/g.   LUNGS: CTA bilat, nonlabored resps, good aeration in all lung fields. [place]: With head turn to the right she felt significant increase in nausea and head fullness and some moving sensation when moved supine.  No nystagmus.  With sitting back up she had recurrence of the symptoms.  Each time the symptoms seem to last about 15 seconds. No significant symptoms with head turn to the left.  LABS:  Last CBC Lab Results  Component Value Date   WBC 7.2 02/08/2023   HGB 13.5 02/08/2023   HCT 40.5 02/08/2023   MCV 89.2 02/08/2023   MCH 29.7 02/08/2023   RDW 12.3 02/08/2023   PLT 253 [id number]   Last metabolic panel Lab Results  Component Value Date   GLUCOSE 99 02/08/2023   NA 139 02/08/2023   K 4.6 02/08/2023   CL 102 02/08/2023   CO2 24 02/08/2023   BUN 8 02/08/2023   CREATININE 0.83 02/08/2023   GFRNONAA >60 12/18/2020   CALCIUM 9.9 02/08/2023   PROT 7.0 02/08/2023   ALBUMIN 4.2 08/04/2022   BILITOT 0.4 02/08/2023   ALKPHOS 62 08/04/2022   AST 12 02/08/2023   ALT 11 02/08/2023   ANIONGAP 8 12/18/2020   Last lipids Lab Results  Component Value Date   CHOL 140 02/08/2023   HDL 47 (L) 02/08/2023   LDLCALC 76 02/08/2023   TRIG 83 02/08/2023   CHOLHDL 3.0 02/08/2023   Last thyroid functions Lab  Results  Component Value Date   TSH 1.43 02/08/2023   T4TOTAL 11.2 02/25/2019   IMPRESSION AND PLAN:  #1 nonspecific dizziness and nausea.  Fits best with positional vertigo. Home Epley maneuvers discussed, handout given.  #2 hypertension.  BP is elevated in the office but Home blood pressures near 130/80 consistently.  Hesitant to change dose of lisinopril in the context of her dizziness lately.  I do not think her dizziness is blood pressure related.  An After Visit Summary was printed and given to the patient.  FOLLOW UP: Return in about 2 days (around 03/08/2023) for f/u dizzy/bppv.  Signed:  Crissie Sickles, MD  03/06/2023  

## 2023-03-08 ENCOUNTER — Ambulatory Visit (INDEPENDENT_AMBULATORY_CARE_PROVIDER_SITE_OTHER): Payer: Medicare HMO | Admitting: Family Medicine

## 2023-03-08 ENCOUNTER — Encounter: Payer: Self-pay | Admitting: Family Medicine

## 2023-03-08 VITALS — BP 128/81 | HR 88 | Wt 183.6 lb

## 2023-03-08 DIAGNOSIS — Z8669 Personal history of other diseases of the nervous system and sense organs: Secondary | ICD-10-CM

## 2023-03-08 DIAGNOSIS — H8110 Benign paroxysmal vertigo, unspecified ear: Secondary | ICD-10-CM

## 2023-03-08 NOTE — Progress Notes (Signed)
OFFICE VISIT  03/08/2023  CC:  Chief Complaint  Patient presents with   Follow-up    Follow up dizziness. No other questions or concerns    Patient is a 72 y.o. female who presents for 2-day follow-up dizziness. A/P as of last visit: "#1 nonspecific dizziness and nausea.  Fits best with positional vertigo. Home Epley maneuvers discussed, handout given.   #2 hypertension.  BP is elevated in the office but Home blood pressures near 130/80 consistently.  Hesitant to change dose of lisinopril in the context of her dizziness lately.  I do not think her dizziness is blood pressure related."  INTERIM HX: Marissa Gibbs is feeling well. Her dizziness is essentially resolved.  She has been doing the Epley maneuvers as instructed. No new concerns.   Past Medical History:  Diagnosis Date   Abnormal mammogram of right breast 10/2015   Benign-appearing cysts on f/u diagnostic mammo and ultrasound: recommended stay on annual mammogram screening schedule   Allergy    Cervical cancer screening 12/2017   Pap normal-->no further pap screening indicated.   Cervical polyp 12/2017   resected 01/2018 by Dr. Ailene Rud   Colon cancer screening 01/10/2018   iFOB neg 01/2018.  iFOB neg 01/2019.  Cologuard NEG 08/2019. Colonoscopy w/out polyps 01/2021->recall 10 yrs.   Essential hypertension 2017   GERD (gastroesophageal reflux disease)    Hypercholesterolemia 01/2018   Excellent response to 20mg  atorva 2019   Hypothyroidism, postsurgical    Sigmoid diverticulosis     Past Surgical History:  Procedure Laterality Date   CERVICAL POLYPECTOMY  01/2018   COLONOSCOPY  06/2007   Dr. Sharlett Iles: descending colon and sigmoid colon diverticulosis. Internal hemorrhoids. Otherwise normal.    COLONOSCOPY WITH PROPOFOL N/A 01/25/2021   No polyps. +sigmoid diverticulosis and nonbleeding int hemorr. Procedure: COLONOSCOPY WITH PROPOFOL;  Surgeon: Eloise Harman, DO;  Location: [facility];  Service: Endoscopy;   Laterality: N/A;  12:45pm   DEXA  08/2006; 01/2018; 06/2020   2007; T -0.3.  2019 T score -0.5. 2021 T score -0.8.  2024 T score -0.9   ENDOMETRIAL BIOPSY  12/1999   Benign   THYROIDECTOMY  07/02/2015   Dr. Kelli Churn   TUBAL LIGATION  03/03/1989    Outpatient Medications Prior to Visit  Medication Sig Dispense Refill   atorvastatin (LIPITOR) 20 MG tablet Take 1 tablet (20 mg total) by mouth daily. 90 tablet 1   calcium-vitamin D (OSCAL WITH D) 250-125 MG-UNIT per tablet Take 1 tablet by mouth daily.     cetirizine (ZYRTEC) 10 MG tablet Take 10 mg by mouth daily.     levothyroxine (SYNTHROID) 112 MCG tablet Take 1 tablet (112 mcg total) by mouth daily before breakfast. 90 tablet 1   lisinopril (ZESTRIL) 30 MG tablet Take 1 tablet (30 mg total) by mouth daily. 90 tablet 1   Multiple Vitamins-Minerals (MULTIVITAMIN PO) Take 1 tablet by mouth daily.     NEOMYCIN-POLYMYXIN-HYDROCORTISONE (CORTISPORIN) 1 % SOLN OTIC solution 2 drops each ear three times a day 10 mL 1   pantoprazole (PROTONIX) 40 MG tablet Take 1 tablet (40 mg total) by mouth daily. 90 tablet 1   No facility-administered medications prior to visit.    No Known Allergies  Review of Systems As per HPI  PE:    03/08/2023   11:02 AM 03/06/2023    8:29 AM 03/06/2023    8:10 AM  Vitals with BMI  Height   5\' 0"   Weight 183 lbs 10 oz  182 lbs  BMI [id number]  [id number]  Systolic 0000000 Q000111Q 0000000  Diastolic 81 78 81  Pulse 88  80     Physical Exam  Gen: Alert, well appearing.  Patient is oriented to person, place, time, and situation. AFFECT: pleasant, lucid thought and speech. No further exam today.  LABS:  Last CBC Lab Results  Component Value Date   WBC 7.2 02/08/2023   HGB 13.5 02/08/2023   HCT 40.5 02/08/2023   MCV 89.2 02/08/2023   MCH 29.7 02/08/2023   RDW 12.3 02/08/2023   PLT 253 [id number]   Last metabolic panel Lab Results  Component Value Date   GLUCOSE 99 02/08/2023   NA 139 02/08/2023   K 4.6 02/08/2023    CL 102 02/08/2023   CO2 24 02/08/2023   BUN 8 02/08/2023   CREATININE 0.83 02/08/2023   GFRNONAA >60 12/18/2020   CALCIUM 9.9 02/08/2023   PROT 7.0 02/08/2023   ALBUMIN 4.2 08/04/2022   BILITOT 0.4 02/08/2023   ALKPHOS 62 08/04/2022   AST 12 02/08/2023   ALT 11 02/08/2023   ANIONGAP 8 12/18/2020   IMPRESSION AND PLAN:  BPPV, resolved with home Epley maneuvers.  An After Visit Summary was printed and given to the patient.  FOLLOW UP: No follow-ups on file. Next cpe 02/2024  Signed:  Crissie Sickles, MD           03/08/2023

## 2023-05-17 ENCOUNTER — Ambulatory Visit (INDEPENDENT_AMBULATORY_CARE_PROVIDER_SITE_OTHER): Payer: Medicare HMO

## 2023-05-17 VITALS — Wt 183.0 lb

## 2023-05-17 DIAGNOSIS — Z0000 Encounter for general adult medical examination without abnormal findings: Secondary | ICD-10-CM | POA: Diagnosis not present

## 2023-05-17 NOTE — Patient Instructions (Signed)
Marissa Gibbs , Thank you for taking time to come for your Medicare Wellness Visit. I appreciate your ongoing commitment to your health goals. Please review the following plan we discussed and let me know if I can assist you in the future.   These are the goals we discussed:  Goals      educational material send every 6 months     Evidence-based guidance:  Promote initial use of ambulatory blood pressure measurements (for 3 days) to rule out "white-coat" effect; identify masked hypertension and presence or absence of nocturnal "dipping" of blood pressure.   Encourage continued use of home blood pressure monitoring and recording in blood pressure log; include symptoms of hypotension or potential medication side effects in log.  Review blood pressure measurements taken inside and outside of the provider office; establish baseline and monitor trends; compare to target ranges or patient goal.  Share overall cardiovascular risk with patient; encourage changes to lifestyle risk factors, including alcohol consumption, smoking, inadequate exercise, poor dietary habits and stress.   Notes:  [id number] RN sent educational book a matter of choice blood pressure control [id number] [facility] has discontinued the educational mailing program     Patient Stated     Increase water intake.      Patient Stated     Lose weight         This is a list of the screening recommended for you and due dates:  Health Maintenance  Topic Date Due   COVID-19 Vaccine (4 - 2023-24 season) 08/05/2022   Flu Shot  07/06/2023   Medicare Annual Wellness Visit  05/16/2024   DTaP/Tdap/Td vaccine (2 - Td or Tdap) 07/29/2024   Mammogram  09/12/2024   Colon Cancer Screening  01/25/2031   Pneumonia Vaccine  Completed   DEXA scan (bone density measurement)  Completed   Hepatitis C Screening  Completed   Zoster (Shingles) Vaccine  Completed   HPV Vaccine  Aged Out    Advanced directives: Advance directive  discussed with you today. I have provided a copy for you to complete at home and have notarized. Once this is complete please bring a copy in to our office so we can scan it into your chart.  Conditions/risks identified: lose weight   Next appointment: Follow up in one year for your annual wellness visit    Preventive Care 65 Years and Older, Female Preventive care refers to lifestyle choices and visits with your health care provider that can promote health and wellness. What does preventive care include? A yearly physical exam. This is also called an annual well check. Dental exams once or twice a year. Routine eye exams. Ask your health care provider how often you should have your eyes checked. Personal lifestyle choices, including: Daily care of your teeth and gums. Regular physical activity. Eating a healthy diet. Avoiding tobacco and drug use. Limiting alcohol use. Practicing safe sex. Taking low-dose aspirin every day. Taking vitamin and mineral supplements as recommended by your health care provider. What happens during an annual well check? The services and screenings done by your health care provider during your annual well check will depend on your age, overall health, lifestyle risk factors, and family history of disease. Counseling  Your health care provider may ask you questions about your: Alcohol use. Tobacco use. Drug use. Emotional well-being. Home and relationship well-being. Sexual activity. Eating habits. History of falls. Memory and ability to understand (cognition). Work and work Astronomer. Reproductive health. Screening  You  may have the following tests or measurements: Height, weight, and BMI. Blood pressure. Lipid and cholesterol levels. These may be checked every 5 years, or more frequently if you are over 26 years old. Skin check. Lung cancer screening. You may have this screening every year starting at age 32 if you have a 30-pack-year history  of smoking and currently smoke or have quit within the past 15 years. Fecal occult blood test (FOBT) of the stool. You may have this test every year starting at age 71. Flexible sigmoidoscopy or colonoscopy. You may have a sigmoidoscopy every 5 years or a colonoscopy every 10 years starting at age 21. Hepatitis C blood test. Hepatitis B blood test. Sexually transmitted disease (STD) testing. Diabetes screening. This is done by checking your blood sugar (glucose) after you have not eaten for a while (fasting). You may have this done every 1-3 years. Bone density scan. This is done to screen for osteoporosis. You may have this done starting at age 65. Mammogram. This may be done every 1-2 years. Talk to your health care provider about how often you should have regular mammograms. Talk with your health care provider about your test results, treatment options, and if necessary, the need for more tests. Vaccines  Your health care provider may recommend certain vaccines, such as: Influenza vaccine. This is recommended every year. Tetanus, diphtheria, and acellular pertussis (Tdap, Td) vaccine. You may need a Td booster every 10 years. Zoster vaccine. You may need this after age 39. Pneumococcal 13-valent conjugate (PCV13) vaccine. One dose is recommended after age 51. Pneumococcal polysaccharide (PPSV23) vaccine. One dose is recommended after age 50. Talk to your health care provider about which screenings and vaccines you need and how often you need them. This information is not intended to replace advice given to you by your health care provider. Make sure you discuss any questions you have with your health care provider. Document Released: 12/18/2015 Document Revised: 08/10/2016 Document Reviewed: 09/22/2015 Elsevier Interactive Patient Education  2017 [employer].  Fall Prevention in the Home Falls can cause injuries. They can happen to people of all ages. There are many things you can do to  make your home safe and to help prevent falls. What can I do on the outside of my home? Regularly fix the edges of walkways and driveways and fix any cracks. Remove anything that might make you trip as you walk through a door, such as a raised step or threshold. Trim any bushes or trees on the path to your home. Use bright outdoor lighting. Clear any walking paths of anything that might make someone trip, such as rocks or tools. Regularly check to see if handrails are loose or broken. Make sure that both sides of any steps have handrails. Any raised decks and porches should have guardrails on the edges. Have any leaves, snow, or ice cleared regularly. Use sand or salt on walking paths during winter. Clean up any spills in your garage right away. This includes oil or grease spills. What can I do in the bathroom? Use night lights. Install grab bars by the toilet and in the tub and shower. Do not use towel bars as grab bars. Use non-skid mats or decals in the tub or shower. If you need to sit down in the shower, use a plastic, non-slip stool. Keep the floor dry. Clean up any water that spills on the floor as soon as it happens. Remove soap buildup in the tub or shower regularly. Attach  bath mats securely with double-sided non-slip rug tape. Do not have throw rugs and other things on the floor that can make you trip. What can I do in the bedroom? Use night lights. Make sure that you have a light by your bed that is easy to reach. Do not use any sheets or blankets that are too big for your bed. They should not hang down onto the floor. Have a firm chair that has side arms. You can use this for support while you get dressed. Do not have throw rugs and other things on the floor that can make you trip. What can I do in the kitchen? Clean up any spills right away. Avoid walking on wet floors. Keep items that you use a lot in easy-to-reach places. If you need to reach something above you, use a  strong step stool that has a grab bar. Keep electrical cords out of the way. Do not use floor polish or wax that makes floors slippery. If you must use wax, use non-skid floor wax. Do not have throw rugs and other things on the floor that can make you trip. What can I do with my stairs? Do not leave any items on the stairs. Make sure that there are handrails on both sides of the stairs and use them. Fix handrails that are broken or loose. Make sure that handrails are as long as the stairways. Check any carpeting to make sure that it is firmly attached to the stairs. Fix any carpet that is loose or worn. Avoid having throw rugs at the top or bottom of the stairs. If you do have throw rugs, attach them to the floor with carpet tape. Make sure that you have a light switch at the top of the stairs and the bottom of the stairs. If you do not have them, ask someone to add them for you. What else can I do to help prevent falls? Wear shoes that: Do not have high heels. Have rubber bottoms. Are comfortable and fit you well. Are closed at the toe. Do not wear sandals. If you use a stepladder: Make sure that it is fully opened. Do not climb a closed stepladder. Make sure that both sides of the stepladder are locked into place. Ask someone to hold it for you, if possible. Clearly mark and make sure that you can see: Any grab bars or handrails. First and last steps. Where the edge of each step is. Use tools that help you move around (mobility aids) if they are needed. These include: Canes. Walkers. Scooters. Crutches. Turn on the lights when you go into a dark area. Replace any light bulbs as soon as they burn out. Set up your furniture so you have a clear path. Avoid moving your furniture around. If any of your floors are uneven, fix them. If there are any pets around you, be aware of where they are. Review your medicines with your doctor. Some medicines can make you feel dizzy. This can  increase your chance of falling. Ask your doctor what other things that you can do to help prevent falls. This information is not intended to replace advice given to you by your health care provider. Make sure you discuss any questions you have with your health care provider. Document Released: 09/17/2009 Document Revised: 04/28/2016 Document Reviewed: 12/26/2014 Elsevier Interactive Patient Education  2017 [employer].

## 2023-05-17 NOTE — Progress Notes (Signed)
I connected with  Marissa Gibbs on 05/17/23 by a audio enabled telemedicine application and verified that I am speaking with the correct person using two identifiers.  Patient Location: Home  Provider Location: Home Office  I discussed the limitations of evaluation and management by telemedicine. The patient expressed understanding and agreed to proceed.   Subjective:   Marissa Gibbs is a 72 y.o. female who presents for Medicare Annual (Subsequent) preventive examination.  Review of Systems     Cardiac Risk Factors include: advanced age (>63men, >72 women);obesity (BMI >30kg/m2);hypertension;dyslipidemia     Objective:    Today's Vitals   05/17/23 1404  Weight: 183 lb (83 kg)   Body mass index is 35.74 kg/m.     05/17/2023    2:09 PM 08/06/2022    9:40 AM 01/25/2021   11:03 AM 08/14/2019    9:07 AM  Advanced Directives  Does Patient Have a Medical Advance Directive? No Yes No No  Type of IT consultant of Healthcare Power of Attorney in Chart?  No - copy requested    Would patient like information on creating a medical advance directive? Yes (MAU/Ambulatory/Procedural Areas - Information given)  No - Patient declined No - Patient declined    Current Medications (verified) Outpatient Encounter Medications as of 05/17/2023  Medication Sig   atorvastatin (LIPITOR) 20 MG tablet Take 1 tablet (20 mg total) by mouth daily.   calcium-vitamin D (OSCAL WITH D) 250-125 MG-UNIT per tablet Take 1 tablet by mouth daily.   cetirizine (ZYRTEC) 10 MG tablet Take 10 mg by mouth daily.   levothyroxine (SYNTHROID) 112 MCG tablet Take 1 tablet (112 mcg total) by mouth daily before breakfast.   lisinopril (ZESTRIL) 30 MG tablet Take 1 tablet (30 mg total) by mouth daily.   Multiple Vitamins-Minerals (MULTIVITAMIN PO) Take 1 tablet by mouth daily.   NEOMYCIN-POLYMYXIN-HYDROCORTISONE (CORTISPORIN) 1 % SOLN OTIC solution 2 drops each ear three times a day    pantoprazole (PROTONIX) 40 MG tablet Take 1 tablet (40 mg total) by mouth daily.   No facility-administered encounter medications on file as of 05/17/2023.    Allergies (verified) Patient has no known allergies.   History: Past Medical History:  Diagnosis Date   Abnormal mammogram of right breast 10/2015   Benign-appearing cysts on f/u diagnostic mammo and ultrasound: recommended stay on annual mammogram screening schedule   Allergy    Cervical cancer screening 12/2017   Pap normal-->no further pap screening indicated.   Cervical polyp 12/2017   resected 01/2018 by Dr. Katheran Awe   Colon cancer screening 01/10/2018   iFOB neg 01/2018.  iFOB neg 01/2019.  Cologuard NEG 08/2019. Colonoscopy w/out polyps 01/2021->recall 10 yrs.   Essential hypertension 2017   GERD (gastroesophageal reflux disease)    Hypercholesterolemia 01/2018   Excellent response to 20mg  atorva 2019   Hypothyroidism, postsurgical    Sigmoid diverticulosis    Past Surgical History:  Procedure Laterality Date   CERVICAL POLYPECTOMY  01/2018   COLONOSCOPY  06/2007   Dr. Jarold Motto: descending colon and sigmoid colon diverticulosis. Internal hemorrhoids. Otherwise normal.    COLONOSCOPY WITH PROPOFOL N/A 01/25/2021   No polyps. +sigmoid diverticulosis and nonbleeding int hemorr. Procedure: COLONOSCOPY WITH PROPOFOL;  Surgeon: Lanelle Bal, DO;  Location: [facility];  Service: Endoscopy;  Laterality: N/A;  12:45pm   DEXA  08/2006; 01/2018; 06/2020   2007; T -0.3.  2019 T score -0.5. 2021 T score -  0.8.  2024 T score -0.9   ENDOMETRIAL BIOPSY  12/1999   Benign   THYROIDECTOMY  07/02/2015   Dr. Charlynne Pander   TUBAL LIGATION  03/03/1989   Family History  Problem Relation Age of Onset   Arthritis Mother    Breast cancer Mother 26   Hypertension Mother    Diabetes Mother    Heart attack Father    Diabetes Father    Alcohol abuse Father    Thyroid disease Neg Hx    Colon cancer Neg Hx    Colon polyps Neg Hx     Social History   Socioeconomic History   Marital status: Married    Spouse name: Not on file   Number of children: Not on file   Years of education: Not on file   Highest education level: 12th grade  Occupational History   Not on file  Tobacco Use   Smoking status: Never   Smokeless tobacco: Never  Vaping Use   Vaping Use: Never used  Substance and Sexual Activity   Alcohol use: No   Drug use: No   Sexual activity: Not on file  Other Topics Concern   Not on file  Social History Narrative   Married, one biologic daughter and one stepson.   Education: 12 grade.   Occupation: Woodworker Tax inspector).   No T/A/Ds.         Social Determinants of Health   Financial Resource Strain: Low Risk  (05/17/2023)   Overall Financial Resource Strain (CARDIA)    Difficulty of Paying Living Expenses: Not hard at all  Food Insecurity: No Food Insecurity (05/17/2023)   Hunger Vital Sign    Worried About Running Out of Food in the Last Year: Never true    Ran Out of Food in the Last Year: Never true  Transportation Needs: No Transportation Needs (05/17/2023)   PRAPARE - Administrator, Civil Service (Medical): No    Lack of Transportation (Non-Medical): No  Physical Activity: Inactive (05/17/2023)   Exercise Vital Sign    Days of Exercise per Week: 0 days    Minutes of Exercise per Session: 0 min  Stress: Stress Concern Present (05/17/2023)   [employer] of Occupational Health - Occupational Stress Questionnaire    Feeling of Stress : To some extent  Social Connections: Socially Integrated (05/17/2023)   Social Connection and Isolation Panel [NHANES]    Frequency of Communication with Friends and Family: More than three times a week    Frequency of Social Gatherings with Friends and Family: Twice a week    Attends Religious Services: More than 4 times per year    Active Member of [employer] or Organizations: Yes    Attends Banker Meetings: 1 to 4 times  per year    Marital Status: Married    Tobacco Counseling Counseling given: Not Answered   Clinical Intake:  Pre-visit preparation completed: Yes  Pain : No/denies pain     BMI - recorded: 35.74 Nutritional Status: BMI > 30  Obese Nutritional Risks: None Diabetes: No  How often do you need to have someone help you when you read instructions, pamphlets, or other written materials from your doctor or pharmacy?: 1 - Never  Diabetic?no  Interpreter Needed?: No  Comments: Lanier Ensign, LPN   Activities of Daily Living    05/17/2023    2:09 PM 08/06/2022    9:26 AM  In your present state of health, do you have any difficulty  performing the following activities:  Hearing? 0 0  Vision? 0 0  Difficulty concentrating or making decisions? 0 0  Walking or climbing stairs? 0 0  Dressing or bathing? 0 0  Doing errands, shopping? 0 0  Preparing Food and eating ? N N  Using the Toilet? N N  In the past six months, have you accidently leaked urine? Y N  Comment wears a pad   Do you have problems with loss of bowel control? N N  Managing your Medications? N N  Managing your Finances? N N  Housekeeping or managing your Housekeeping? N N    Patient Care Team: Jeoffrey Massed, MD as PCP - General (Family Medicine) Annamaria Helling, MD as Consulting Physician (Obstetrics and Gynecology) Lanelle Bal, DO as Consulting Physician (Gastroenterology) Teryl Lucy, MD as Consulting Physician (Orthopedic Surgery)  Indicate any recent Medical Services you may have received from other than [facility] providers in the past year (date may be approximate).     Assessment:   This is a routine wellness examination for Jowanna.  Hearing/Vision screen Hearing Screening - Comments:: Pt denies any hearing issues  Vision Screening - Comments:: Pt follows up with eye dr in [facility] for annual eye exams   Dietary issues and exercise activities discussed: Current Exercise Habits: The patient does  not participate in regular exercise at present   Goals Addressed             This Visit's Progress    Patient Stated       Lose weight        Depression Screen    05/17/2023    2:08 PM 02/08/2023    8:06 AM 08/06/2022    9:29 AM 01/21/2022    8:11 AM 01/13/2021    8:07 AM 01/09/2020    1:08 PM 08/14/2019    9:08 AM  PHQ 2/9 Scores  PHQ - 2 Score 0 0 0 0 0 1 0    Fall Risk    05/17/2023    2:09 PM 03/02/2023    8:39 AM 02/08/2023    8:06 AM 08/06/2022    9:30 AM 01/21/2022    8:11 AM  Fall Risk   Falls in the past year? 0 0 0 0 0  Number falls in past yr: 0  0 0 0  Injury with Fall? 0  0 0 0  Risk for fall due to : Impaired vision  No Fall Risks    Follow up Falls prevention discussed  Falls evaluation completed Falls evaluation completed Falls evaluation completed    FALL RISK PREVENTION PERTAINING TO THE HOME:  Any stairs in or around the home? Yes  If so, are there any without handrails? No  Home free of loose throw rugs in walkways, pet beds, electrical cords, etc? Yes  Adequate lighting in your home to reduce risk of falls? Yes   ASSISTIVE DEVICES UTILIZED TO PREVENT FALLS:  Life alert? No  Use of a cane, walker or w/c? No  Grab bars in the bathroom? Yes  Shower chair or bench in shower? No  Elevated toilet seat or a handicapped toilet? No   TIMED UP AND GO:  Was the test performed? No .   Cognitive Function:    08/14/2019    9:09 AM  MMSE - Mini Mental State Exam  Orientation to time 5  Orientation to Place 5  Registration 3  Attention/ Calculation 5  Recall 3  Language- name 2 objects 2  Language- repeat 1  Language- follow 3 step command 3  Language- read & follow direction 1  Write a sentence 1  Copy design 1  Total score 30        05/17/2023    2:10 PM 08/06/2022   10:17 AM  6CIT Screen  What Year? 0 points 0 points  What month? 0 points 0 points  What time? 0 points 0 points  Count back from 20 0 points 0 points  Months in reverse 0 points    Repeat phrase 0 points 0 points  Total Score 0 points     Immunizations Immunization History  Administered Date(s) Administered   Fluad Quad(high Dose 65+) 08/14/2019, 11/10/2020, 01/21/2022   Influenza, High Dose Seasonal PF 11/10/2017, 08/14/2018, 08/12/2022   Influenza,inj,Quad PF,6+ Mos 10/02/2015, 10/10/2016   Moderna Sars-Covid-2 Vaccination 03/31/2020, 04/28/2020, 12/22/2020   Pneumococcal Conjugate-13 01/03/2018   Pneumococcal Polysaccharide-23 12/31/2018   Tdap 07/29/2014   Zoster Recombinat (Shingrix) 11/30/2020, 02/19/2021    TDAP status: Up to date  Flu Vaccine status: Up to date  Pneumococcal vaccine status: Up to date  Covid-19 vaccine status: Completed vaccines  Qualifies for Shingles Vaccine? Yes   Zostavax completed Yes   Shingrix Completed?: Yes  Screening Tests Health Maintenance  Topic Date Due   COVID-19 Vaccine (4 - 2023-24 season) 08/05/2022   INFLUENZA VACCINE  07/06/2023   Medicare Annual Wellness (AWV)  05/16/2024   DTaP/Tdap/Td (2 - Td or Tdap) 07/29/2024   MAMMOGRAM  09/12/2024   Colonoscopy  01/25/2031   Pneumonia Vaccine 8+ Years old  Completed   DEXA SCAN  Completed   Hepatitis C Screening  Completed   Zoster Vaccines- Shingrix  Completed   HPV VACCINES  Aged Out    Health Maintenance  Health Maintenance Due  Topic Date Due   COVID-19 Vaccine (4 - 2023-24 season) 08/05/2022    Colorectal cancer screening: Type of screening: Colonoscopy. Completed 01/25/21. Repeat every 10 years  Mammogram status: Completed 09/12/22. Repeat every year  Bone Density status: Completed 02/01/23. Results reflect: Bone density results: NORMAL. Repeat every 2 years.   Additional Screening:  Hepatitis C Screening: Completed 01/03/18  Vision Screening: Recommended annual ophthalmology exams for early detection of glaucoma and other disorders of the eye. Is the patient up to date with their annual eye exam?  Yes  Who is the provider or what is the  name of the office in which the patient attends annual eye exams? [facility] [place]  If pt is not established with a provider, would they like to be referred to a provider to establish care? No .   Dental Screening: Recommended annual dental exams for proper oral hygiene  Community Resource Referral / Chronic Care Management: CRR required this visit?  No   CCM required this visit?  No      Plan:     I have personally reviewed and noted the following in the patient's chart:   Medical and social history Use of alcohol, tobacco or illicit drugs  Current medications and supplements including opioid prescriptions. Patient is not currently taking opioid prescriptions. Functional ability and status Nutritional status Physical activity Advanced directives List of other physicians Hospitalizations, surgeries, and ER visits in previous 12 months Vitals Screenings to include cognitive, depression, and falls Referrals and appointments  In addition, I have reviewed and discussed with patient certain preventive protocols, quality metrics, and best practice recommendations. A written personalized care plan for preventive services as well as general preventive  health recommendations were provided to patient.     Marzella Schlein, LPN   [id number]   Nurse Notes: none

## 2023-05-28 ENCOUNTER — Other Ambulatory Visit: Payer: Self-pay | Admitting: Family Medicine

## 2023-07-02 ENCOUNTER — Other Ambulatory Visit: Payer: Self-pay | Admitting: Family Medicine

## 2023-07-20 ENCOUNTER — Telehealth: Payer: Self-pay

## 2023-07-20 NOTE — Telephone Encounter (Signed)
Chart record updated as of 8/15

## 2023-07-20 NOTE — Telephone Encounter (Signed)
Patient wanted to update her records here. She received her flu shot today at [facility]  Patient not expecting a return call unless clinical team has any questions for patient.

## 2023-08-04 ENCOUNTER — Other Ambulatory Visit (HOSPITAL_COMMUNITY): Payer: Self-pay | Admitting: Obstetrics & Gynecology

## 2023-08-04 DIAGNOSIS — Z1231 Encounter for screening mammogram for malignant neoplasm of breast: Secondary | ICD-10-CM

## 2023-08-23 DIAGNOSIS — H35363 Drusen (degenerative) of macula, bilateral: Secondary | ICD-10-CM | POA: Diagnosis not present

## 2023-08-23 DIAGNOSIS — H1045 Other chronic allergic conjunctivitis: Secondary | ICD-10-CM | POA: Diagnosis not present

## 2023-08-23 DIAGNOSIS — H1789 Other corneal scars and opacities: Secondary | ICD-10-CM | POA: Diagnosis not present

## 2023-08-23 DIAGNOSIS — H5203 Hypermetropia, bilateral: Secondary | ICD-10-CM | POA: Diagnosis not present

## 2023-08-23 DIAGNOSIS — H2513 Age-related nuclear cataract, bilateral: Secondary | ICD-10-CM | POA: Diagnosis not present

## 2023-08-23 DIAGNOSIS — H524 Presbyopia: Secondary | ICD-10-CM | POA: Diagnosis not present

## 2023-08-23 DIAGNOSIS — H52223 Regular astigmatism, bilateral: Secondary | ICD-10-CM | POA: Diagnosis not present

## 2023-09-07 ENCOUNTER — Ambulatory Visit: Payer: Medicare HMO | Admitting: Family Medicine

## 2023-09-07 ENCOUNTER — Encounter: Payer: Self-pay | Admitting: Family Medicine

## 2023-09-07 VITALS — BP 124/80 | HR 73 | Temp 97.7°F | Ht 60.0 in | Wt 185.4 lb

## 2023-09-07 DIAGNOSIS — I10 Essential (primary) hypertension: Secondary | ICD-10-CM

## 2023-09-07 DIAGNOSIS — E039 Hypothyroidism, unspecified: Secondary | ICD-10-CM

## 2023-09-07 DIAGNOSIS — E7800 Pure hypercholesterolemia, unspecified: Secondary | ICD-10-CM

## 2023-09-07 DIAGNOSIS — L309 Dermatitis, unspecified: Secondary | ICD-10-CM | POA: Diagnosis not present

## 2023-09-07 LAB — LIPID PANEL
Cholesterol: 153 mg/dL (ref 0–200)
HDL: 57.1 mg/dL (ref >39.00)
LDL Cholesterol: 78 mg/dL (ref 0–99)
NonHDL: 95.5
Total CHOL/HDL Ratio: 3
Triglycerides: 90 mg/dL (ref 0.0–149.0)
VLDL: 18 mg/dL (ref 0.0–40.0)

## 2023-09-07 LAB — COMPREHENSIVE METABOLIC PANEL
ALT: 12 U/L (ref 0–35)
AST: 14 U/L (ref 0–37)
Albumin: 4.5 g/dL (ref 3.5–5.2)
Alkaline Phosphatase: 57 U/L (ref 39–117)
BUN: 14 mg/dL (ref 6–23)
CO2: 29 meq/L (ref 19–32)
Calcium: 10.1 mg/dL (ref 8.4–10.5)
Chloride: 102 meq/L (ref 96–112)
Creatinine, Ser: 0.72 mg/dL (ref 0.40–1.20)
GFR: 84.47 mL/min (ref >60.00)
Glucose, Bld: 101 mg/dL — ABNORMAL HIGH (ref 70–99)
Potassium: 4.6 meq/L (ref 3.5–5.1)
Sodium: 138 meq/L (ref 135–145)
Total Bilirubin: 0.4 mg/dL (ref 0.2–1.2)
Total Protein: 7.4 g/dL (ref 6.0–8.3)

## 2023-09-07 LAB — TSH: TSH: 1.5 u[IU]/mL (ref 0.35–5.50)

## 2023-09-07 NOTE — Progress Notes (Signed)
OFFICE VISIT  09/07/2023  CC:  Chief Complaint  Patient presents with   Medical Management of Chronic Issues    Pt is fasting    Patient is a 72 y.o. female who presents for 7-month follow-up hypertension, hyperlipidemia, and hypothyroidism. A/P as of last visit: "#1  hypertension, historically well-controlled.  Discordant readings from home measurements and office measurements lately (signif elevated here today). No changes today but I recommended she check her blood pressure and heart rate every day and write these down and bring back for review with me in 2 weeks. Electrolytes and creatinine today.   #2 hyperlipidemia, doing well on atorvastatin 20 mg a day.   3.  Hypothyroidism, continue 112 mcg levothyroxine daily. TSH today."  INTERIM HX: Haeleigh feels well. She has some itching in her ears on and off.  She has an itchy patch of skin in the right antecubital fossa. Cortisporin otic drops have helped her ears in the past.  Home blood pressures consistently in the 120s over 70s.  ROS as above, plus--> no fevers, no CP, no SOB, no wheezing, no cough, no dizziness, no HAs,  no melena/hematochezia.  No polyuria or polydipsia.  No myalgias or arthralgias.  No focal weakness, paresthesias, or tremors.  No acute vision or hearing abnormalities.  No dysuria or unusual/new urinary urgency or frequency.  No recent changes in lower legs. No n/v/d or abd pain.  No palpitations.    Past Medical History:  Diagnosis Date   Abnormal mammogram of right breast 10/2015   Benign-appearing cysts on f/u diagnostic mammo and ultrasound: recommended stay on annual mammogram screening schedule   Allergy    Cervical cancer screening 12/2017   Pap normal-->no further pap screening indicated.   Cervical polyp 12/2017   resected 01/2018 by Dr. Katheran Awe   Colon cancer screening 01/10/2018   iFOB neg 01/2018.  iFOB neg 01/2019.  Cologuard NEG 08/2019. Colonoscopy w/out polyps 01/2021->recall 10 yrs.    Essential hypertension 2017   GERD (gastroesophageal reflux disease)    Hypercholesterolemia 01/2018   Excellent response to 20mg  atorva 2019   Hypothyroidism, postsurgical    Sigmoid diverticulosis     Past Surgical History:  Procedure Laterality Date   CERVICAL POLYPECTOMY  01/2018   COLONOSCOPY  06/2007   Dr. Jarold Motto: descending colon and sigmoid colon diverticulosis. Internal hemorrhoids. Otherwise normal.    COLONOSCOPY WITH PROPOFOL N/A 01/25/2021   No polyps. +sigmoid diverticulosis and nonbleeding int hemorr. Procedure: COLONOSCOPY WITH PROPOFOL;  Surgeon: Lanelle Bal, DO;  Location: [facility];  Service: Endoscopy;  Laterality: N/A;  12:45pm   DEXA  08/2006; 01/2018; 06/2020   2007; T -0.3.  2019 T score -0.5. 2021 T score -0.8.  2024 T score -0.9   ENDOMETRIAL BIOPSY  12/1999   Benign   THYROIDECTOMY  07/02/2015   Dr. Charlynne Pander   TUBAL LIGATION  03/03/1989    Outpatient Medications Prior to Visit  Medication Sig Dispense Refill   atorvastatin (LIPITOR) 20 MG tablet TAKE 1 TABLET DAILY 90 tablet 1   calcium-vitamin D (OSCAL WITH D) 250-125 MG-UNIT per tablet Take 1 tablet by mouth daily.     cetirizine (ZYRTEC) 10 MG tablet Take 10 mg by mouth daily.     levothyroxine (SYNTHROID) 112 MCG tablet TAKE 1 TABLET DAILY BEFORE BREAKFAST 90 tablet 1   lisinopril (ZESTRIL) 30 MG tablet TAKE 1 TABLET DAILY 90 tablet 1   Multiple Vitamins-Minerals (MULTIVITAMIN PO) Take 1 tablet by mouth daily.  NEOMYCIN-POLYMYXIN-HYDROCORTISONE (CORTISPORIN) 1 % SOLN OTIC solution 2 drops each ear three times a day 10 mL 1   pantoprazole (PROTONIX) 40 MG tablet TAKE 1 TABLET DAILY 90 tablet 1   No facility-administered medications prior to visit.    No Known Allergies  Review of Systems As per HPI  PE:    09/07/2023    8:08 AM 09/07/2023    7:56 AM 05/17/2023    2:04 PM  Vitals with BMI  Height  5\' 0"    Weight  185 lbs 6 oz 183 lbs  BMI  36.21   Systolic 124 147    Diastolic 80 81   Pulse  73      Physical Exam  Gen: Alert, well appearing.  Patient is oriented to person, place, time, and situation. AFFECT: pleasant, lucid thought and speech. EARS: Auditory canals without erythema or swelling.  TMs normal.  Minimal cerumen. Skin shows pinkish patch of maculopapular rash in the right antecubital fossa. CV: RRR, no m/r EXT: no clubbing or cyanosis.  no edema.    LABS:  Last CBC Lab Results  Component Value Date   WBC 7.2 02/08/2023   HGB 13.5 02/08/2023   HCT 40.5 02/08/2023   MCV 89.2 02/08/2023   MCH 29.7 02/08/2023   RDW 12.3 02/08/2023   PLT 253 02/08/2023   Last metabolic panel Lab Results  Component Value Date   GLUCOSE 99 02/08/2023   NA 139 02/08/2023   K 4.6 02/08/2023   CL 102 02/08/2023   CO2 24 02/08/2023   BUN 8 02/08/2023   CREATININE 0.83 02/08/2023   GFR 81.05 08/04/2022   CALCIUM 9.9 02/08/2023   PROT 7.0 02/08/2023   ALBUMIN 4.2 08/04/2022   BILITOT 0.4 02/08/2023   ALKPHOS 62 08/04/2022   AST 12 02/08/2023   ALT 11 02/08/2023   ANIONGAP 8 12/18/2020   Last lipids Lab Results  Component Value Date   CHOL 140 02/08/2023   HDL 47 (L) 02/08/2023   LDLCALC 76 02/08/2023   TRIG 83 02/08/2023   CHOLHDL 3.0 02/08/2023   Last hemoglobin A1c Lab Results  Component Value Date   HGBA1C 5.8 10/02/2015   Last thyroid functions Lab Results  Component Value Date   TSH 1.43 02/08/2023   T4TOTAL 11.2 02/25/2019   IMPRESSION AND PLAN:  #1  hypertension, historically well-controlled On lisinopril 30 mg a day. Electrolytes and creatinine today.   #2 hyperlipidemia, doing well on atorvastatin 20 mg a day. Lipid panel and hepatic panel today.  3.  Hypothyroidism, continue 112 mcg levothyroxine daily. TSH today.  #4 eczema right arm. She can apply over-the-counter hydrocortisone as needed.  5.  Eczema and external auditory canals. She has some Cortisporin otic drops to use as needed.  If she needs  more she will call for refill request.  An After Visit Summary was printed and given to the patient.  FOLLOW UP: Return in about 6 months (around 03/07/2024) for annual CPE (fasting). Cpe 6 mo Signed:  Santiago Bumpers, MD           09/07/2023

## 2023-09-20 ENCOUNTER — Ambulatory Visit (HOSPITAL_COMMUNITY)
Admission: RE | Admit: 2023-09-20 | Discharge: 2023-09-20 | Disposition: A | Discharge status: Home / Self Care | Payer: Medicare HMO | Source: Ambulatory Visit | Attending: Obstetrics & Gynecology | Admitting: Obstetrics & Gynecology

## 2023-09-20 DIAGNOSIS — Z1231 Encounter for screening mammogram for malignant neoplasm of breast: Secondary | ICD-10-CM | POA: Insufficient documentation

## 2023-09-26 ENCOUNTER — Other Ambulatory Visit: Payer: Self-pay | Admitting: Family Medicine

## 2023-11-24 ENCOUNTER — Other Ambulatory Visit: Payer: Self-pay | Admitting: Family Medicine

## 2023-11-30 ENCOUNTER — Encounter: Payer: Self-pay | Admitting: Family Medicine

## 2023-11-30 ENCOUNTER — Ambulatory Visit: Payer: Medicare HMO | Admitting: Family Medicine

## 2023-11-30 VITALS — BP 146/77 | HR 78 | Temp 97.9°F | Wt 185.6 lb

## 2023-11-30 DIAGNOSIS — J069 Acute upper respiratory infection, unspecified: Secondary | ICD-10-CM

## 2023-11-30 MED ORDER — HYDROCODONE BIT-HOMATROP MBR 5-1.5 MG/5ML PO SOLN: ORAL | 0 refills | Status: DC

## 2023-11-30 MED ORDER — LISINOPRIL 30 MG PO TABS: 30.0000 mg | ORAL_TABLET | Freq: Every day | ORAL | 3 refills | Status: DC

## 2023-11-30 MED ORDER — ATORVASTATIN CALCIUM 20 MG PO TABS: 20.0000 mg | ORAL_TABLET | Freq: Every day | ORAL | 3 refills | Status: DC

## 2023-11-30 MED ORDER — PANTOPRAZOLE SODIUM 40 MG PO TBEC: 40.0000 mg | DELAYED_RELEASE_TABLET | Freq: Every day | ORAL | 3 refills | Status: DC

## 2023-11-30 MED ORDER — LEVOTHYROXINE SODIUM 112 MCG PO TABS: 112.0000 ug | ORAL_TABLET | Freq: Every day | ORAL | 3 refills | Status: DC

## 2023-11-30 NOTE — Progress Notes (Signed)
OFFICE VISIT  11/30/2023  CC:  Chief Complaint  Patient presents with   Cough    1 wk    Patient is a 72 y.o. female who presents for cough.  HPI: 1 week ago she started to get postnasal drip and a cough.  No fever, no malaise, no body aches.  She says she essentially feels well just has a cough.  No chest tightness or shortness of breath or wheezing. The cough is worse at night.  She has some clear mucus when she blows her nose.  No sore throat or headache.  No nausea, vomiting, or diarrhea. She has taken some over-the-counter cold syrup.  She took a dose of Mucinex yesterday.  No known sick contacts.  Past Medical History:  Diagnosis Date   Abnormal mammogram of right breast 10/2015   Benign-appearing cysts on f/u diagnostic mammo and ultrasound: recommended stay on annual mammogram screening schedule   Allergy    Cervical cancer screening 12/2017   Pap normal-->no further pap screening indicated.   Cervical polyp 12/2017   resected 01/2018 by Dr. Katheran Awe   Colon cancer screening 01/10/2018   iFOB neg 01/2018.  iFOB neg 01/2019.  Cologuard NEG 08/2019. Colonoscopy w/out polyps 01/2021->recall 10 yrs.   Essential hypertension 2017   GERD (gastroesophageal reflux disease)    Hypercholesterolemia 01/2018   Excellent response to 20mg  atorva 2019   Hypothyroidism, postsurgical    Sigmoid diverticulosis     Past Surgical History:  Procedure Laterality Date   CERVICAL POLYPECTOMY  01/2018   COLONOSCOPY  06/2007   Dr. Jarold Motto: descending colon and sigmoid colon diverticulosis. Internal hemorrhoids. Otherwise normal.    COLONOSCOPY WITH PROPOFOL N/A 01/25/2021   No polyps. +sigmoid diverticulosis and nonbleeding int hemorr. Procedure: COLONOSCOPY WITH PROPOFOL;  Surgeon: Lanelle Bal, DO;  Location: [facility];  Service: Endoscopy;  Laterality: N/A;  12:45pm   DEXA  08/2006; 01/2018; 06/2020   2007; T -0.3.  2019 T score -0.5. 2021 T score -0.8.  2024 T score -0.9    ENDOMETRIAL BIOPSY  12/1999   Benign   THYROIDECTOMY  07/02/2015   Dr. Charlynne Pander   TUBAL LIGATION  03/03/1989    Outpatient Medications Prior to Visit  Medication Sig Dispense Refill   atorvastatin (LIPITOR) 20 MG tablet TAKE 1 TABLET DAILY 90 tablet 1   calcium-vitamin D (OSCAL WITH D) 250-125 MG-UNIT per tablet Take 1 tablet by mouth daily.     cetirizine (ZYRTEC) 10 MG tablet Take 10 mg by mouth daily.     levothyroxine (SYNTHROID) 112 MCG tablet TAKE 1 TABLET DAILY BEFORE BREAKFAST 90 tablet 1   lisinopril (ZESTRIL) 30 MG tablet TAKE 1 TABLET DAILY 90 tablet 1   Multiple Vitamins-Minerals (MULTIVITAMIN PO) Take 1 tablet by mouth daily.     NEOMYCIN-POLYMYXIN-HYDROCORTISONE (CORTISPORIN) 1 % SOLN OTIC solution 2 drops each ear three times a day 10 mL 1   pantoprazole (PROTONIX) 40 MG tablet TAKE 1 TABLET DAILY 90 tablet 1   No facility-administered medications prior to visit.    No Known Allergies  Review of Systems  As per HPI  PE:    11/30/2023   10:59 AM 11/30/2023   10:58 AM 09/07/2023    8:08 AM  Vitals with BMI  Weight  185 lbs 10 oz   Systolic 146 163 295  Diastolic 77 85 80  Pulse  78      Physical Exam  VS: noted--normal. Gen: alert, NAD, NONTOXIC APPEARING. HEENT: eyes without  injection, drainage, or swelling.  Ears: EACs clear, TMs with normal light reflex and landmarks.  Nose: Clear rhinorrhea, with some dried, crusty exudate adherent to mildly injected mucosa.  No purulent d/c.  No paranasal sinus TTP.  No facial swelling.  Throat and mouth without focal lesion.  No pharyngial swelling, erythema, or exudate.   Neck: supple, no LAD.   LUNGS: CTA bilat, nonlabored resps.   CV: RRR, no m/r/g. EXT: no c/c/e SKIN: no rash   LABS:  Last CBC Lab Results  Component Value Date   WBC 7.2 02/08/2023   HGB 13.5 02/08/2023   HCT 40.5 02/08/2023   MCV 89.2 02/08/2023   MCH 29.7 02/08/2023   RDW 12.3 02/08/2023   PLT 253 02/08/2023   Last metabolic  panel Lab Results  Component Value Date   GLUCOSE 101 (H) 09/07/2023   NA 138 09/07/2023   K 4.6 09/07/2023   CL 102 09/07/2023   CO2 29 09/07/2023   BUN 14 09/07/2023   CREATININE 0.72 09/07/2023   GFR 84.47 09/07/2023   CALCIUM 10.1 09/07/2023   PROT 7.4 09/07/2023   ALBUMIN 4.5 09/07/2023   BILITOT 0.4 09/07/2023   ALKPHOS 57 09/07/2023   AST 14 09/07/2023   ALT 12 09/07/2023   ANIONGAP 8 12/18/2020   IMPRESSION AND PLAN:  URI with cough and congestion. Suspect viral etiology. Recommended she take Mucinex DM in the morning and I did a prescription for Hycodan syrup to take 1 to 2 teaspoons at bedtime for cough. Signs/symptoms to call or return for were reviewed and pt expressed understanding.  An After Visit Summary was printed and given to the patient.  FOLLOW UP: No follow-ups on file.  Signed:  Santiago Bumpers, MD           11/30/2023

## 2023-11-30 NOTE — Patient Instructions (Signed)
Take over the counter mucinex DM every morning. You don't need to take the over the counter cough syrup.  I sent in rx for hydrocodone cough syrup to take at bedtime

## 2024-01-01 ENCOUNTER — Ambulatory Visit: Payer: Self-pay | Admitting: Family Medicine

## 2024-01-01 NOTE — Telephone Encounter (Signed)
Copied from CRM [id number]. Topic: Clinical - Medication Question >> Jan 01, 2024  9:30 AM Florestine Avers wrote: Reason for CRM: Patient received a letter Saturday from [facility] mail order pharmacy stating that, it has been a recall on one of her prescriptions. The patient wants to know if she should stop taking them or what should happen next. Its her Levothyroxine , patient is requesting a call back with information.

## 2024-01-01 NOTE — Telephone Encounter (Signed)
Please get more information from patient. Does the letter give any guidance on what her provider should do?  Is there a specific lot number that is recalled or is it for levothyroxine in general? Lots of times the recall letters have this type of information, otherwise all I can do is prescribe her a new bottle of the same medication. Let me know.

## 2024-01-03 MED ORDER — LEVOTHYROXINE SODIUM 112 MCG PO TABS: 112.0000 ug | ORAL_TABLET | Freq: Every day | ORAL | 3 refills | Status: DC

## 2024-01-03 NOTE — Telephone Encounter (Signed)
OK, I'll send in new rx.

## 2024-02-07 DIAGNOSIS — Z01419 Encounter for gynecological examination (general) (routine) without abnormal findings: Secondary | ICD-10-CM | POA: Diagnosis not present

## 2024-03-07 ENCOUNTER — Ambulatory Visit (INDEPENDENT_AMBULATORY_CARE_PROVIDER_SITE_OTHER): Payer: Medicare HMO | Admitting: Family Medicine

## 2024-03-07 ENCOUNTER — Encounter: Payer: Self-pay | Admitting: Family Medicine

## 2024-03-07 VITALS — BP 161/84 | HR 78 | Ht 60.5 in | Wt 191.2 lb

## 2024-03-07 DIAGNOSIS — Z0000 Encounter for general adult medical examination without abnormal findings: Secondary | ICD-10-CM

## 2024-03-07 DIAGNOSIS — E039 Hypothyroidism, unspecified: Secondary | ICD-10-CM

## 2024-03-07 DIAGNOSIS — E7800 Pure hypercholesterolemia, unspecified: Secondary | ICD-10-CM

## 2024-03-07 DIAGNOSIS — I10 Essential (primary) hypertension: Secondary | ICD-10-CM | POA: Diagnosis not present

## 2024-03-07 MED ORDER — NEOMYCIN-POLYMYXIN-HC 1 % OT SOLN: OTIC | 3 refills | Status: AC

## 2024-03-07 NOTE — Progress Notes (Signed)
 Office Note 03/07/2024  CC:  Chief Complaint  Patient presents with   Annual Exam    Pt is fasting   HPI:  Patient is a 73 y.o. female who is here for annual health maintenance exam and 26-month follow-up hypertension, hyperlipidemia, and hypothyroidism.  Lots of stress this week with illnesses and surgeries of friends.  Husband with lots of gout problems.  Home blood pressures consistently less than 130/80.  Past Medical History:  Diagnosis Date   Abnormal mammogram of right breast 10/2015   Benign-appearing cysts on f/u diagnostic mammo and ultrasound: recommended stay on annual mammogram screening schedule   Allergy    Cervical cancer screening 12/2017   Pap normal-->no further pap screening indicated.   Cervical polyp 12/2017   resected 01/2018 by Dr. Katheran Awe   Colon cancer screening 01/10/2018   iFOB neg 01/2018.  iFOB neg 01/2019.  Cologuard NEG 08/2019. Colonoscopy w/out polyps 01/2021->recall 10 yrs.   Essential hypertension 2017   GERD (gastroesophageal reflux disease)    Hypercholesterolemia 01/2018   Excellent response to 20mg  atorva 2019   Hypothyroidism, postsurgical    Sigmoid diverticulosis     Past Surgical History:  Procedure Laterality Date   CERVICAL POLYPECTOMY  01/2018   COLONOSCOPY  06/2007   Dr. Jarold Motto: descending colon and sigmoid colon diverticulosis. Internal hemorrhoids. Otherwise normal.    COLONOSCOPY WITH PROPOFOL N/A 01/25/2021   No polyps. +sigmoid diverticulosis and nonbleeding int hemorr. Procedure: COLONOSCOPY WITH PROPOFOL;  Surgeon: Lanelle Bal, DO;  Location: [facility];  Service: Endoscopy;  Laterality: N/A;  12:45pm   DEXA  08/2006; 01/2018; 06/2020   2007; T -0.3.  2019 T score -0.5. 2021 T score -0.8.  2024 T score -0.9   ENDOMETRIAL BIOPSY  12/1999   Benign   THYROIDECTOMY  07/02/2015   Dr. Charlynne Pander   TUBAL LIGATION  03/03/1989    Family History  Problem Relation Age of Onset   Arthritis Mother    Breast  cancer Mother 65   Hypertension Mother    Diabetes Mother    Heart attack Father    Diabetes Father    Alcohol abuse Father    Thyroid disease Neg Hx    Colon cancer Neg Hx    Colon polyps Neg Hx     Social History   Socioeconomic History   Marital status: Married    Spouse name: Not on file   Number of children: Not on file   Years of education: Not on file   Highest education level: 12th grade  Occupational History   Not on file  Tobacco Use   Smoking status: Never   Smokeless tobacco: Never  Vaping Use   Vaping status: Never Used  Substance and Sexual Activity   Alcohol use: No   Drug use: No   Sexual activity: Not on file  Other Topics Concern   Not on file  Social History Narrative   Married, one biologic daughter and one stepson.   Education: 12 grade.   Occupation: Woodworker Tax inspector).   No T/A/Ds.         Social Drivers of Corporate investment banker Strain: Low Risk  (05/17/2023)   Overall Financial Resource Strain (CARDIA)    Difficulty of Paying Living Expenses: Not hard at all  Food Insecurity: No Food Insecurity (05/17/2023)   Hunger Vital Sign    Worried About Running Out of Food in the Last Year: Never true    Ran Out of Food  in the Last Year: Never true  Transportation Needs: No Transportation Needs (05/17/2023)   PRAPARE - Administrator, Civil Service (Medical): No    Lack of Transportation (Non-Medical): No  Physical Activity: Inactive (05/17/2023)   Exercise Vital Sign    Days of Exercise per Week: 0 days    Minutes of Exercise per Session: 0 min  Stress: Stress Concern Present (05/17/2023)   [employer] of Occupational Health - Occupational Stress Questionnaire    Feeling of Stress : To some extent  Social Connections: Socially Integrated (05/17/2023)   Social Connection and Isolation Panel [NHANES]    Frequency of Communication with Friends and Family: More than three times a week    Frequency of Social  Gatherings with Friends and Family: Twice a week    Attends Religious Services: More than 4 times per year    Active Member of [employer] or Organizations: Yes    Attends Banker Meetings: 1 to 4 times per year    Marital Status: Married  Catering manager Violence: Not At Risk (05/17/2023)   Humiliation, Afraid, Rape, and Kick questionnaire    Fear of Current or Ex-Partner: No    Emotionally Abused: No    Physically Abused: No    Sexually Abused: No    Outpatient Medications Prior to Visit  Medication Sig Dispense Refill   atorvastatin (LIPITOR) 20 MG tablet Take 1 tablet (20 mg total) by mouth daily. 90 tablet 3   calcium-vitamin D (OSCAL WITH D) 250-125 MG-UNIT per tablet Take 1 tablet by mouth daily.     cetirizine (ZYRTEC) 10 MG tablet Take 10 mg by mouth daily.     levothyroxine (SYNTHROID) 112 MCG tablet Take 1 tablet (112 mcg total) by mouth daily before breakfast. Patient's most recent rx was recalled.  Pls dispense different manufacturer. 90 tablet 3   lisinopril (ZESTRIL) 30 MG tablet Take 1 tablet (30 mg total) by mouth daily. 90 tablet 3   metroNIDAZOLE (METROGEL) 0.75 % vaginal gel Place 1 Applicatorful vaginally at bedtime.     Multiple Vitamins-Minerals (MULTIVITAMIN PO) Take 1 tablet by mouth daily.     pantoprazole (PROTONIX) 40 MG tablet Take 1 tablet (40 mg total) by mouth daily. 90 tablet 3   NEOMYCIN-POLYMYXIN-HYDROCORTISONE (CORTISPORIN) 1 % SOLN OTIC solution 2 drops each ear three times a day 10 mL 1   HYDROcodone bit-homatropine (HYCODAN) 5-1.5 MG/5ML syrup 1-2 tsp po at bedtime prn cough 120 mL 0   No facility-administered medications prior to visit.    No Known Allergies  Review of Systems  Constitutional:  Negative for appetite change, chills, fatigue and fever.  HENT:  Negative for congestion, dental problem, ear pain and sore throat.   Eyes:  Negative for discharge, redness and visual disturbance.  Respiratory:  Negative for cough, chest  tightness, shortness of breath and wheezing.   Cardiovascular:  Negative for chest pain, palpitations and leg swelling.  Gastrointestinal:  Negative for abdominal pain, blood in stool, diarrhea, nausea and vomiting.  Genitourinary:  Negative for difficulty urinating, dysuria, flank pain, frequency, hematuria and urgency.  Musculoskeletal:  Negative for arthralgias, back pain, joint swelling, myalgias and neck stiffness.  Skin:  Negative for pallor and rash.  Neurological:  Negative for dizziness, speech difficulty, weakness and headaches.  Hematological:  Negative for adenopathy. Does not bruise/bleed easily.  Psychiatric/Behavioral:  Negative for confusion and sleep disturbance. The patient is not nervous/anxious.     PE;    03/07/2024  8:24 AM 03/07/2024    8:22 AM 11/30/2023   10:59 AM  Vitals with BMI  Height  5' 0.5"   Weight  191 lbs 3 oz   BMI  36.71   Systolic 161 170 409  Diastolic 84 91 77  Pulse  78    Gen: Alert, well appearing.  Patient is oriented to person, place, time, and situation. AFFECT: pleasant, lucid thought and speech. ENT: Ears: EACs clear.  Flaky and slightly pink epithelium.  No swelling or erythema or exudate.  TMs with good light reflex and landmarks bilaterally.  Eyes: no injection, icteris, swelling, or exudate.  EOMI, PERRLA. Nose: no drainage or turbinate edema/swelling.  No injection or focal lesion.  Mouth: lips without lesion/swelling.  Oral mucosa pink and moist.  Dentition intact and without obvious caries or gingival swelling.  Oropharynx without erythema, exudate, or swelling.  Neck: supple/nontender.  No LAD, mass, or TM.  Carotid pulses 2+ bilaterally, without bruits. CV: RRR, no m/r/g.   LUNGS: CTA bilat, nonlabored resps, good aeration in all lung fields. ABD: soft, NT, ND, BS normal.  No hepatospenomegaly or mass.  No bruits. EXT: no clubbing, cyanosis, or edema.  Musculoskeletal: no joint swelling, erythema, warmth, or tenderness.  ROM of  all joints intact. Skin - no sores or suspicious lesions or rashes or color changes  Pertinent labs:  Lab Results  Component Value Date   TSH 1.50 09/07/2023   Lab Results  Component Value Date   WBC 7.2 02/08/2023   HGB 13.5 02/08/2023   HCT 40.5 02/08/2023   MCV 89.2 02/08/2023   PLT 253 02/08/2023   Lab Results  Component Value Date   CREATININE 0.72 09/07/2023   BUN 14 09/07/2023   NA 138 09/07/2023   K 4.6 09/07/2023   CL 102 09/07/2023   CO2 29 09/07/2023   Lab Results  Component Value Date   ALT 12 09/07/2023   AST 14 09/07/2023   ALKPHOS 57 09/07/2023   BILITOT 0.4 09/07/2023   Lab Results  Component Value Date   CHOL 153 09/07/2023   Lab Results  Component Value Date   HDL 57.10 09/07/2023   Lab Results  Component Value Date   LDLCALC 78 09/07/2023   Lab Results  Component Value Date   TRIG 90.0 09/07/2023   Lab Results  Component Value Date   CHOLHDL 3 09/07/2023   Lab Results  Component Value Date   HGBA1C 5.8 10/02/2015   ASSESSMENT AND PLAN:   No problem-specific Assessment & Plan notes found for this encounter.  1 health maintenance exam: Reviewed age and gender appropriate health maintenance issues (prudent diet, regular exercise, health risks of tobacco and excessive alcohol, use of seatbelts, fire alarms in home, use of sunscreen).  Also reviewed age and gender appropriate health screening as well as vaccine recommendations. Vaccines: All up-to-date Labs: Fasting health panel Cervical ca screening: No further screening is indicated Breast ca screening: Next mammogram due October 2025. Colon ca screening: No polyps 2022.  Consider repeat 2032. Osteoporosis screening: Bone density normal February 2024.  Repeat approx feb 2026.  #2 hypertension, well-controlled on lisinopril 30 mg a day. Electrolytes and creatinine monitoring today.  3.  Hypercholesterolemia, doing well on a atorvastatin 20 mg a day. Lipid panel and hepatic panel  today.  4.  Hypothyroidism, doing well on levothyroxine 112 mcg once a day. TSH today.  An After Visit Summary was printed and given to the patient.  FOLLOW UP:  Return  in about 6 months (around 09/06/2024) for routine chronic illness f/u.  Signed:  Santiago Bumpers, MD           03/07/2024

## 2024-03-08 ENCOUNTER — Encounter: Payer: Self-pay | Admitting: Family Medicine

## 2024-03-08 LAB — LIPID PANEL
Cholesterol: 141 mg/dL (ref <200)
HDL: 59 mg/dL (ref >=50)
LDL Cholesterol (Calc): 64 mg/dL
Non-HDL Cholesterol (Calc): 82 mg/dL (ref <130)
Total CHOL/HDL Ratio: 2.4 (calc) (ref <5.0)
Triglycerides: 97 mg/dL (ref <150)

## 2024-03-08 LAB — CBC WITH DIFFERENTIAL/PLATELET
Absolute Lymphocytes: 2843 {cells}/uL (ref 850–3900)
Absolute Monocytes: 428 {cells}/uL (ref 200–950)
Basophils Absolute: 83 {cells}/uL (ref 0–200)
Basophils Relative: 1.1 %
Eosinophils Absolute: 180 {cells}/uL (ref 15–500)
Eosinophils Relative: 2.4 %
HCT: 40.1 % (ref 35.0–45.0)
Hemoglobin: 13.1 g/dL (ref 11.7–15.5)
MCH: 30.1 pg (ref 27.0–33.0)
MCHC: 32.7 g/dL (ref 32.0–36.0)
MCV: 92.2 fL (ref 80.0–100.0)
MPV: 10.4 fL (ref 7.5–12.5)
Monocytes Relative: 5.7 %
Neutro Abs: 3968 {cells}/uL (ref 1500–7800)
Neutrophils Relative %: 52.9 %
Platelets: 239 10*3/uL (ref 140–400)
RBC: 4.35 10*6/uL (ref 3.80–5.10)
RDW: 12.1 % (ref 11.0–15.0)
Total Lymphocyte: 37.9 %
WBC: 7.5 10*3/uL (ref 3.8–10.8)

## 2024-03-08 LAB — COMPREHENSIVE METABOLIC PANEL WITH GFR
AG Ratio: 1.7 (calc) (ref 1.0–2.5)
ALT: 14 U/L (ref 6–29)
AST: 14 U/L (ref 10–35)
Albumin: 4.3 g/dL (ref 3.6–5.1)
Alkaline phosphatase (APISO): 63 U/L (ref 37–153)
BUN: 11 mg/dL (ref 7–25)
CO2: 28 mmol/L (ref 20–32)
Calcium: 9.6 mg/dL (ref 8.6–10.4)
Chloride: 104 mmol/L (ref 98–110)
Creat: 0.76 mg/dL (ref 0.60–1.00)
Globulin: 2.6 g/dL (ref 1.9–3.7)
Glucose, Bld: 97 mg/dL (ref 65–99)
Potassium: 4.2 mmol/L (ref 3.5–5.3)
Sodium: 142 mmol/L (ref 135–146)
Total Bilirubin: 0.5 mg/dL (ref 0.2–1.2)
Total Protein: 6.9 g/dL (ref 6.1–8.1)
eGFR: 84 mL/min/{1.73_m2} (ref >=60)

## 2024-03-08 LAB — TSH: TSH: 3.82 m[IU]/L (ref 0.40–4.50)

## 2024-06-05 ENCOUNTER — Ambulatory Visit: Admitting: *Deleted

## 2024-06-05 VITALS — Ht 60.0 in | Wt 191.0 lb

## 2024-06-05 DIAGNOSIS — Z0000 Encounter for general adult medical examination without abnormal findings: Secondary | ICD-10-CM | POA: Diagnosis not present

## 2024-06-05 NOTE — Progress Notes (Signed)
 Subjective:   Marissa Gibbs is a 73 y.o. female who presents for Medicare Annual (Subsequent) preventive examination.  Visit Complete: Virtual I connected with  Marissa Gibbs on 06/05/24 by a audio enabled telemedicine application and verified that I am speaking with the correct person using two identifiers.  Patient Location: Home  Provider Location: Home Office  I discussed the limitations of evaluation and management by telemedicine. The patient expressed understanding and agreed to proceed.  Vital Signs: Because this visit was a virtual/telehealth visit, some criteria may be missing or patient reported. Any vitals not documented were not able to be obtained and vitals that have been documented are patient reported.    Cardiac Risk Factors include: advanced age (>41men, >9 women);hypertension;obesity (BMI >30kg/m2)     Objective:    Today's Vitals   06/05/24 1556  Weight: 191 lb (86.6 kg)  Height: 5' (1.524 m)   Body mass index is 37.3 kg/m.     06/05/2024    3:56 PM 05/17/2023    2:09 PM 08/06/2022    9:40 AM 01/25/2021   11:03 AM 08/14/2019    9:07 AM  Advanced Directives  Does Patient Have a Medical Advance Directive? No No Yes No No  Type of Office manager of Healthcare Power of Attorney in Chart?   No - copy requested    Would patient like information on creating a medical advance directive? No - Patient declined Yes (MAU/Ambulatory/Procedural Areas - Information given)  No - Patient declined No - Patient declined    Current Medications (verified) Outpatient Encounter Medications as of 06/05/2024  Medication Sig   atorvastatin  (LIPITOR) 20 MG tablet Take 1 tablet (20 mg total) by mouth daily.   calcium -vitamin D (OSCAL WITH D) 250-125 MG-UNIT per tablet Take 1 tablet by mouth daily.   cetirizine (ZYRTEC) 10 MG tablet Take 10 mg by mouth daily.   levothyroxine  (SYNTHROID ) 112 MCG tablet Take 1 tablet (112 mcg total) by  mouth daily before breakfast. Patient's most recent rx was recalled.  Pls dispense different manufacturer.   lisinopril  (ZESTRIL ) 30 MG tablet Take 1 tablet (30 mg total) by mouth daily.   metroNIDAZOLE  (METROGEL ) 0.75 % vaginal gel Place 1 Applicatorful vaginally at bedtime.   Multiple Vitamins-Minerals (MULTIVITAMIN PO) Take 1 tablet by mouth daily.   NEOMYCIN -POLYMYXIN-HYDROCORTISONE  (CORTISPORIN) 1 % SOLN OTIC solution 2 drops each ear three times a day   pantoprazole  (PROTONIX ) 40 MG tablet Take 1 tablet (40 mg total) by mouth daily.   No facility-administered encounter medications on file as of 06/05/2024.    Allergies (verified) Patient has no known allergies.   History: Past Medical History:  Diagnosis Date   Abnormal mammogram of right breast 10/2015   Benign-appearing cysts on f/u diagnostic mammo and ultrasound: recommended stay on annual mammogram screening schedule   Allergy    Cervical cancer screening 12/2017   Pap normal-->no further pap screening indicated.   Cervical polyp 12/2017   resected 01/2018 by Dr. Orvilla   Colon cancer screening 01/10/2018   iFOB neg 01/2018.  iFOB neg 01/2019.  Cologuard NEG 08/2019. Colonoscopy w/out polyps 01/2021->recall 10 yrs.   Essential hypertension 2017   GERD (gastroesophageal reflux disease)    Hypercholesterolemia 01/2018   Excellent response to 20mg  atorva 2019   Hypothyroidism, postsurgical    Sigmoid diverticulosis    Past Surgical History:  Procedure Laterality Date   CERVICAL POLYPECTOMY  01/2018  COLONOSCOPY  06/2007   Dr. Jakie: descending colon and sigmoid colon diverticulosis. Internal hemorrhoids. Otherwise normal.    COLONOSCOPY WITH PROPOFOL  N/A 01/25/2021   No polyps. +sigmoid diverticulosis and nonbleeding int hemorr. Procedure: COLONOSCOPY WITH PROPOFOL ;  Surgeon: Cindie Carlin POUR, DO;  Location: [facility];  Service: Endoscopy;  Laterality: N/A;  12:45pm   DEXA  08/2006; 01/2018; 06/2020   2007; T -0.3.   2019 T score -0.5. 2021 T score -0.8.  2024 T score -0.9   ENDOMETRIAL BIOPSY  12/1999   Benign   THYROIDECTOMY  07/02/2015   Dr. Reid   TUBAL LIGATION  03/03/1989   Family History  Problem Relation Age of Onset   Arthritis Mother    Breast cancer Mother 7   Hypertension Mother    Diabetes Mother    Heart attack Father    Diabetes Father    Alcohol abuse Father    Thyroid  disease Neg Hx    Colon cancer Neg Hx    Colon polyps Neg Hx    Social History   Socioeconomic History   Marital status: Married    Spouse name: Not on file   Number of children: Not on file   Years of education: Not on file   Highest education level: 12th grade  Occupational History   Not on file  Tobacco Use   Smoking status: Never   Smokeless tobacco: Never  Vaping Use   Vaping status: Never Used  Substance and Sexual Activity   Alcohol use: No   Drug use: No   Sexual activity: Not Currently  Other Topics Concern   Not on file  Social History Narrative   Married, one biologic daughter and one stepson.   Education: 12 grade.   Occupation: Woodworker Tax inspector).   No T/A/Ds.         Social Drivers of Corporate investment banker Strain: Low Risk  (06/05/2024)   Overall Financial Resource Strain (CARDIA)    Difficulty of Paying Living Expenses: Not hard at all  Food Insecurity: No Food Insecurity (06/05/2024)   Hunger Vital Sign    Worried About Running Out of Food in the Last Year: Never true    Ran Out of Food in the Last Year: Never true  Transportation Needs: No Transportation Needs (06/05/2024)   PRAPARE - Administrator, Civil Service (Medical): No    Lack of Transportation (Non-Medical): No  Physical Activity: Insufficiently Active (06/05/2024)   Exercise Vital Sign    Days of Exercise per Week: 2 days    Minutes of Exercise per Session: 20 min  Stress: No Stress Concern Present (06/05/2024)   [employer] of Occupational Health - Occupational Stress  Questionnaire    Feeling of Stress: Not at all  Social Connections: Socially Integrated (06/05/2024)   Social Connection and Isolation Panel    Frequency of Communication with Friends and Family: Three times a week    Frequency of Social Gatherings with Friends and Family: Three times a week    Attends Religious Services: More than 4 times per year    Active Member of Clubs or Organizations: Yes    Attends Engineer, structural: More than 4 times per year    Marital Status: Married    Tobacco Counseling Counseling given: Not Answered   Clinical Intake:  Pre-visit preparation completed: Yes  Pain : No/denies pain     Diabetes: No  How often do you need to have someone help  you when you read instructions, pamphlets, or other written materials from your doctor or pharmacy?: 1 - Never  Interpreter Needed?: No  Information entered by :: Mliss Graff LPN   Activities of Daily Living    06/05/2024    3:56 PM  In your present state of health, do you have any difficulty performing the following activities:  Hearing? 0  Vision? 0  Difficulty concentrating or making decisions? 0  Walking or climbing stairs? 0  Dressing or bathing? 0  Doing errands, shopping? 0  Preparing Food and eating ? N  Using the Toilet? N  In the past six months, have you accidently leaked urine? Y  Do you have problems with loss of bowel control? N  Managing your Medications? N  Managing your Finances? N  Housekeeping or managing your Housekeeping? N    Patient Care Team: Candise Aleene DEL, MD as PCP - General (Family Medicine) Rox Charleston, MD as Consulting Physician (Obstetrics and Gynecology) Cindie Carlin POUR, DO as Consulting Physician (Gastroenterology) Josefina Chew, MD as Consulting Physician (Orthopedic Surgery)  Indicate any recent Medical Services you may have received from other than [facility] providers in the past year (date may be approximate).     Assessment:   This is a  routine wellness examination for Bell.  Hearing/Vision screen Hearing Screening - Comments:: No trouble hearing Vision Screening - Comments:: My eye doctor Up to date   Goals Addressed             This Visit's Progress    Patient Stated   Not on track    Increase water  intake.      Patient Stated   Not on track    Lose weight      Patient Stated       Continue current lifestyle       Depression Screen    06/05/2024    3:57 PM 03/07/2024    8:18 AM 09/07/2023    7:56 AM 05/17/2023    2:08 PM 02/08/2023    8:06 AM 08/06/2022    9:29 AM 01/21/2022    8:11 AM  PHQ 2/9 Scores  PHQ - 2 Score 0 0 0 0 0 0 0  PHQ- 9 Score 0          Fall Risk    06/05/2024    3:55 PM 03/07/2024    8:18 AM 09/07/2023    7:56 AM 05/17/2023    2:09 PM 03/02/2023    8:39 AM  Fall Risk   Falls in the past year? 0 0 0 0 0  Number falls in past yr: 0 0 0 0   Injury with Fall? 0 0 0 0   Risk for fall due to :  No Fall Risks No Fall Risks Impaired vision   Follow up Falls evaluation completed;Education provided;Falls prevention discussed Falls evaluation completed Falls evaluation completed Falls prevention discussed     MEDICARE RISK AT HOME: Medicare Risk at Home Any stairs in or around the home?: No If so, are there any without handrails?: No Home free of loose throw rugs in walkways, pet beds, electrical cords, etc?: Yes Adequate lighting in your home to reduce risk of falls?: Yes Life alert?: No Use of a cane, walker or w/c?: No Grab bars in the bathroom?: No Shower chair or bench in shower?: No Elevated toilet seat or a handicapped toilet?: No  TIMED UP AND GO:  Was the test performed?  No    Cognitive Function:  08/14/2019    9:09 AM  MMSE - Mini Mental State Exam  Orientation to time 5  Orientation to Place 5  Registration 3  Attention/ Calculation 5  Recall 3  Language- name 2 objects 2  Language- repeat 1  Language- follow 3 step command 3  Language- read & follow direction  1  Write a sentence 1  Copy design 1  Total score 30        06/05/2024    3:59 PM 05/17/2023    2:10 PM 08/06/2022   10:17 AM  6CIT Screen  What Year? 0 points 0 points 0 points  What month? 0 points 0 points 0 points  What time? 0 points 0 points 0 points  Count back from 20 0 points 0 points 0 points  Months in reverse 0 points 0 points   Repeat phrase 0 points 0 points 0 points  Total Score 0 points 0 points     Immunizations Immunization History  Administered Date(s) Administered   Fluad Quad(high Dose 65+) 08/14/2019, 11/10/2020, 01/21/2022   Influenza, High Dose Seasonal PF 11/10/2017, 08/14/2018, 08/12/2022, 07/20/2023   Influenza,inj,Quad PF,6+ Mos 10/02/2015, 10/10/2016   Moderna Sars-Covid-2 Vaccination 03/31/2020, 04/28/2020, 12/22/2020   Pneumococcal Conjugate-13 01/03/2018   Pneumococcal Polysaccharide-23 12/31/2018   Tdap 07/29/2014   Zoster Recombinant(Shingrix) 11/30/2020, 02/19/2021    TDAP status: Up to date  Flu Vaccine status: Up to date  Pneumococcal vaccine status: Up to date  Covid-19 vaccine status: Declined, Education has been provided regarding the importance of this vaccine but patient still declined. Advised may receive this vaccine at local pharmacy or Health Dept.or vaccine clinic. Aware to provide a copy of the vaccination record if obtained from local pharmacy or Health Dept. Verbalized acceptance and understanding.  Qualifies for Shingles Vaccine? No   Zostavax completed Yes   Shingrix Completed?: Yes  Screening Tests Health Maintenance  Topic Date Due   INFLUENZA VACCINE  07/05/2024   DTaP/Tdap/Td (2 - Td or Tdap) 07/29/2024   Medicare Annual Wellness (AWV)  06/05/2025   MAMMOGRAM  09/19/2025   Colonoscopy  01/25/2031   Pneumococcal Vaccine: 50+ Years  Completed   DEXA SCAN  Completed   Hepatitis C Screening  Completed   Zoster Vaccines- Shingrix  Completed   Hepatitis B Vaccines  Aged Out   HPV VACCINES  Aged Out    Meningococcal B Vaccine  Aged Out   COVID-19 Vaccine  Discontinued    Health Maintenance  There are no preventive care reminders to display for this patient.   Colorectal cancer screening: No longer required.   Mammogram status: Completed  . Repeat every year  Bone Density status: Completed 2024. Results reflect: Bone density results: NORMAL. Repeat every 0 years.  Lung Cancer Screening: (Low Dose CT Chest recommended if Age 73-80 years, 20 pack-year currently smoking OR have quit w/in 15years.) does not qualify.   Lung Cancer Screening Referral:   Additional Screening:  Hepatitis C Screening: does not qualify; Completed 2019  Vision Screening: Recommended annual ophthalmology exams for early detection of glaucoma and other disorders of the eye. Is the patient up to date with their annual eye exam?  Yes  Who is the provider or what is the name of the office in which the patient attends annual eye exams? [facility] If pt is not established with a provider, would they like to be referred to a provider to establish care? No .   Dental Screening: Recommended annual dental exams for proper  oral hygiene    Community Resource Referral / Chronic Care Management: CRR required this visit?  No   CCM required this visit?  No     Plan:     I have personally reviewed and noted the following in the patient's chart:   Medical and social history Use of alcohol, tobacco or illicit drugs  Current medications and supplements including opioid prescriptions. Patient is not currently taking opioid prescriptions. Functional ability and status Nutritional status Physical activity Advanced directives List of other physicians Hospitalizations, surgeries, and ER visits in previous 12 months Vitals Screenings to include cognitive, depression, and falls Referrals and appointments  In addition, I have reviewed and discussed with patient certain preventive protocols, quality metrics, and  best practice recommendations. A written personalized care plan for preventive services as well as general preventive health recommendations were provided to patient.     Mliss Graff, LPN   01/11/7973   After Visit Summary: (MyChart) Due to this being a telephonic visit, the after visit summary with patients personalized plan was offered to patient via MyChart   Nurse Notes:

## 2024-08-03 ENCOUNTER — Other Ambulatory Visit: Payer: Self-pay | Admitting: Family Medicine

## 2024-08-07 ENCOUNTER — Other Ambulatory Visit (HOSPITAL_COMMUNITY): Payer: Self-pay | Admitting: Obstetrics & Gynecology

## 2024-08-07 DIAGNOSIS — Z1231 Encounter for screening mammogram for malignant neoplasm of breast: Secondary | ICD-10-CM

## 2024-09-09 ENCOUNTER — Encounter: Payer: Self-pay | Admitting: Family Medicine

## 2024-09-09 ENCOUNTER — Ambulatory Visit: Admitting: Family Medicine

## 2024-09-09 VITALS — BP 140/86 | HR 79 | Temp 97.7°F | Ht 60.0 in | Wt 191.0 lb

## 2024-09-09 DIAGNOSIS — L309 Dermatitis, unspecified: Secondary | ICD-10-CM | POA: Diagnosis not present

## 2024-09-09 DIAGNOSIS — E039 Hypothyroidism, unspecified: Secondary | ICD-10-CM

## 2024-09-09 DIAGNOSIS — I10 Essential (primary) hypertension: Secondary | ICD-10-CM

## 2024-09-09 DIAGNOSIS — E7800 Pure hypercholesterolemia, unspecified: Secondary | ICD-10-CM | POA: Diagnosis not present

## 2024-09-09 DIAGNOSIS — R300 Dysuria: Secondary | ICD-10-CM

## 2024-09-09 LAB — POCT URINALYSIS DIPSTICK
Bilirubin, UA: NEGATIVE
Blood, UA: NEGATIVE
Glucose, UA: NEGATIVE
Ketones, UA: NEGATIVE
Leukocytes, UA: NEGATIVE
Nitrite, UA: NEGATIVE
Protein, UA: POSITIVE — AB
Spec Grav, UA: 1.025 (ref 1.010–1.025)
Urobilinogen, UA: 0.2 U/dL
pH, UA: 6 (ref 5.0–8.0)

## 2024-09-09 MED ORDER — SULFAMETHOXAZOLE-TRIMETHOPRIM 800-160 MG PO TABS: 1.0000 | ORAL_TABLET | Freq: Two times a day (BID) | ORAL | 0 refills | Status: AC

## 2024-09-09 NOTE — Progress Notes (Signed)
 OFFICE VISIT  09/09/2024  CC:  Chief Complaint  Patient presents with   Medical Management of Chronic Issues    Pt is fasting    Patient is a 72 y.o. female who presents for 28-month follow-up hypertension, hyperlipidemia, and hypothyroidism. A/P as of last visit: 1 hypertension, well-controlled on lisinopril  30 mg a day. Electrolytes and creatinine monitoring today.   2.  Hypercholesterolemia, doing well on a atorvastatin  20 mg a day. Lipid panel and hepatic panel today.   3.  Hypothyroidism, doing well on levothyroxine  112 mcg once a day. TSH today.  INTERIM HX: Marissa Gibbs feels well other than having some dysuria for the last week.  Describes some urinary urgency as well. No fever, nausea, flank pain or blood in urine.  Home blood pressure typically 115-130 systolic over 70s diastolic.  She has some very itchy spots on the wrists lately.  ROS as above, plus--> no fevers, no CP, no SOB, no wheezing, no cough, no dizziness, no HAs, no rashes, no melena/hematochezia.  No polyuria or polydipsia.  No myalgias or arthralgias.  No focal weakness, paresthesias, or tremors.  No acute vision or hearing abnormalities.  No dysuria or unusual/new urinary urgency or frequency.  No recent changes in lower legs. No n/v/d or abd pain.  No palpitations.     Past Medical History:  Diagnosis Date   Abnormal mammogram of right breast 10/2015   Benign-appearing cysts on f/u diagnostic mammo and ultrasound: recommended stay on annual mammogram screening schedule   Allergy    Cervical cancer screening 12/2017   Pap normal-->no further pap screening indicated.   Cervical polyp 12/2017   resected 01/2018 by Dr. Orvilla   Colon cancer screening 01/10/2018   iFOB neg 01/2018.  iFOB neg 01/2019.  Cologuard NEG 08/2019. Colonoscopy w/out polyps 01/2021->recall 10 yrs.   Essential hypertension 2017   GERD (gastroesophageal reflux disease)    Hypercholesterolemia 01/2018   Excellent response to 20mg   atorva 2019   Hypothyroidism, postsurgical    Sigmoid diverticulosis     Past Surgical History:  Procedure Laterality Date   CERVICAL POLYPECTOMY  01/2018   COLONOSCOPY  06/2007   Dr. Jakie: descending colon and sigmoid colon diverticulosis. Internal hemorrhoids. Otherwise normal.    COLONOSCOPY WITH PROPOFOL  N/A 01/25/2021   No polyps. +sigmoid diverticulosis and nonbleeding int hemorr. Procedure: COLONOSCOPY WITH PROPOFOL ;  Surgeon: Cindie Carlin POUR, DO;  Location: [facility];  Service: Endoscopy;  Laterality: N/A;  12:45pm   DEXA  08/2006; 01/2018; 06/2020   2007; T -0.3.  2019 T score -0.5. 2021 T score -0.8.  2024 T score -0.9   ENDOMETRIAL BIOPSY  12/1999   Benign   THYROIDECTOMY  07/02/2015   Dr. Reid   TUBAL LIGATION  03/03/1989    Outpatient Medications Prior to Visit  Medication Sig Dispense Refill   atorvastatin  (LIPITOR) 20 MG tablet TAKE 1 TABLET DAILY 90 tablet 0   calcium -vitamin D (OSCAL WITH D) 250-125 MG-UNIT per tablet Take 1 tablet by mouth daily.     cetirizine (ZYRTEC) 10 MG tablet Take 10 mg by mouth daily.     levothyroxine  (SYNTHROID ) 112 MCG tablet Take 1 tablet (112 mcg total) by mouth daily before breakfast. Patient's most recent rx was recalled.  Pls dispense different manufacturer. 90 tablet 3   lisinopril  (ZESTRIL ) 30 MG tablet TAKE 1 TABLET DAILY 90 tablet 0   metroNIDAZOLE  (METROGEL ) 0.75 % vaginal gel Place 1 Applicatorful vaginally at bedtime.     Multiple Vitamins-Minerals (  MULTIVITAMIN PO) Take 1 tablet by mouth daily.     NEOMYCIN -POLYMYXIN-HYDROCORTISONE  (CORTISPORIN) 1 % SOLN OTIC solution 2 drops each ear three times a day 10 mL 3   pantoprazole  (PROTONIX ) 40 MG tablet TAKE 1 TABLET DAILY 90 tablet 0   No facility-administered medications prior to visit.    No Known Allergies  Review of Systems As per HPI  PE:    09/09/2024    8:09 AM 06/05/2024    3:56 PM 03/07/2024    8:24 AM  Vitals with BMI  Height 5' 0 5' 0    Weight 191 lbs 191 lbs   BMI 37.3 37.3   Systolic 169  161  Diastolic 92  84  Pulse 79       Physical Exam  Gen: Alert, well appearing.  Patient is oriented to person, place, time, and situation. AFFECT: pleasant, lucid thought and speech. Volar aspect of wrist and palm on each hand has a few flash colored macules with a bit of surface desquamation.  LABS:  Last CBC Lab Results  Component Value Date   WBC 7.5 03/07/2024   HGB 13.1 03/07/2024   HCT 40.1 03/07/2024   MCV 92.2 03/07/2024   MCH 30.1 03/07/2024   RDW 12.1 03/07/2024   PLT 239 03/07/2024   Last metabolic panel Lab Results  Component Value Date   GLUCOSE 97 03/07/2024   NA 142 03/07/2024   K 4.2 03/07/2024   CL 104 03/07/2024   CO2 28 03/07/2024   BUN 11 03/07/2024   CREATININE 0.76 03/07/2024   EGFR 84 03/07/2024   CALCIUM  9.6 03/07/2024   PROT 6.9 03/07/2024   ALBUMIN 4.5 09/07/2023   BILITOT 0.5 03/07/2024   ALKPHOS 57 09/07/2023   AST 14 03/07/2024   ALT 14 03/07/2024   ANIONGAP 8 12/18/2020   Last lipids Lab Results  Component Value Date   CHOL 141 03/07/2024   HDL 59 03/07/2024   LDLCALC 64 03/07/2024   TRIG 97 03/07/2024   CHOLHDL 2.4 03/07/2024   Last hemoglobin A1c Lab Results  Component Value Date   HGBA1C 5.8 10/02/2015   Last thyroid  functions Lab Results  Component Value Date   TSH 3.82 03/07/2024   T4TOTAL 11.2 02/25/2019   THYROIDAB <1 04/17/2015   IMPRESSION AND PLAN:  1 hypertension, well-controlled on lisinopril  30 mg a day. Electrolytes and creatinine monitoring today.   2.  Hypercholesterolemia, doing well on a atorvastatin  20 mg a day. Lipid panel today.   3.  Hypothyroidism, doing well on levothyroxine  112 mcg once a day. TSH today.  #4 dysuria. Urinalysis today showed trace protein, specific gravity 1.025. Otherwise normal. Will send specimen for culture and sensitivities. Based on her symptoms we will treat with Bactrim  double strength, 1 twice daily  x 3 days.  #5 eczema, wrists and palms. Just a few spots. She will try some over-the-counter hydrocortisone .  An After Visit Summary was printed and given to the patient.  FOLLOW UP: No follow-ups on file.  Signed:  Gerlene Hockey, MD           09/09/2024

## 2024-09-10 ENCOUNTER — Ambulatory Visit: Payer: Self-pay | Admitting: Family Medicine

## 2024-09-10 LAB — BASIC METABOLIC PANEL WITH GFR
BUN: 11 mg/dL (ref 7–25)
CO2: 29 mmol/L (ref 20–32)
Calcium: 10 mg/dL (ref 8.6–10.4)
Chloride: 102 mmol/L (ref 98–110)
Creat: 0.71 mg/dL (ref 0.60–1.00)
Glucose, Bld: 98 mg/dL (ref 65–99)
Potassium: 4.3 mmol/L (ref 3.5–5.3)
Sodium: 141 mmol/L (ref 135–146)
eGFR: 91 mL/min/1.73m2 (ref >=60)

## 2024-09-10 LAB — LIPID PANEL
Cholesterol: 135 mg/dL (ref <200)
HDL: 51 mg/dL (ref >=50)
LDL Cholesterol (Calc): 64 mg/dL
Non-HDL Cholesterol (Calc): 84 mg/dL (ref <130)
Total CHOL/HDL Ratio: 2.6 (calc) (ref <5.0)
Triglycerides: 116 mg/dL (ref <150)

## 2024-09-10 LAB — URINE CULTURE
MICRO NUMBER:: 17061170
SPECIMEN QUALITY:: ADEQUATE

## 2024-09-10 LAB — TSH: TSH: 4.37 m[IU]/L (ref 0.40–4.50)

## 2024-09-23 ENCOUNTER — Ambulatory Visit (HOSPITAL_COMMUNITY)
Admission: RE | Admit: 2024-09-23 | Discharge: 2024-09-23 | Disposition: A | Discharge status: Home / Self Care | Source: Ambulatory Visit | Attending: Obstetrics & Gynecology | Admitting: Obstetrics & Gynecology

## 2024-09-23 DIAGNOSIS — Z1231 Encounter for screening mammogram for malignant neoplasm of breast: Secondary | ICD-10-CM | POA: Diagnosis not present

## 2024-10-22 DIAGNOSIS — L820 Inflamed seborrheic keratosis: Secondary | ICD-10-CM | POA: Diagnosis not present

## 2024-11-12 DIAGNOSIS — B078 Other viral warts: Secondary | ICD-10-CM | POA: Diagnosis not present

## 2024-11-12 DIAGNOSIS — L301 Dyshidrosis [pompholyx]: Secondary | ICD-10-CM | POA: Diagnosis not present

## 2024-11-12 DIAGNOSIS — L820 Inflamed seborrheic keratosis: Secondary | ICD-10-CM | POA: Diagnosis not present

## 2024-11-15 NOTE — Progress Notes (Signed)
 Marissa Gibbs                                          MRN: [id number]   11/15/2024   The [facility] Quality Team Specialist reviewed this patient medical record for the purposes of chart review for care gap closure. The following were reviewed: chart review for care gap closure-controlling blood pressure.    [facility] Quality Team

## 2024-12-06 ENCOUNTER — Other Ambulatory Visit: Payer: Self-pay | Admitting: Family Medicine

## 2024-12-31 NOTE — Progress Notes (Signed)
 [facility]                                          MRN: [id number]   12/31/2024   The [facility] Quality Team Specialist reviewed this patient medical record for the purposes of chart review for care gap closure. The following were reviewed: chart review for care gap closure-controlling blood pressure.    [facility] Quality Team

## 2025-03-12 ENCOUNTER — Encounter: Admitting: Family Medicine

## 2025-06-11 ENCOUNTER — Encounter
